# Patient Record
Sex: Male | Born: 1935 | Race: White | Hispanic: No | Smoking: Never smoker
Health system: Southern US, Community
[De-identification: ages and names within clinical notes are randomized; demographics above are authoritative.]

## PROBLEM LIST (undated history)

## (undated) DIAGNOSIS — H9190 Unspecified hearing loss, unspecified ear: Secondary | ICD-10-CM

## (undated) DIAGNOSIS — D649 Anemia, unspecified: Secondary | ICD-10-CM

## (undated) DIAGNOSIS — C801 Malignant (primary) neoplasm, unspecified: Secondary | ICD-10-CM

## (undated) DIAGNOSIS — Z9989 Dependence on other enabling machines and devices: Secondary | ICD-10-CM

## (undated) DIAGNOSIS — I350 Nonrheumatic aortic (valve) stenosis: Secondary | ICD-10-CM

## (undated) DIAGNOSIS — R931 Abnormal findings on diagnostic imaging of heart and coronary circulation: Secondary | ICD-10-CM

## (undated) DIAGNOSIS — I2510 Atherosclerotic heart disease of native coronary artery without angina pectoris: Secondary | ICD-10-CM

## (undated) DIAGNOSIS — Z952 Presence of prosthetic heart valve: Secondary | ICD-10-CM

## (undated) DIAGNOSIS — R911 Solitary pulmonary nodule: Secondary | ICD-10-CM

## (undated) DIAGNOSIS — I77810 Thoracic aortic ectasia: Secondary | ICD-10-CM

## (undated) DIAGNOSIS — F419 Anxiety disorder, unspecified: Secondary | ICD-10-CM

## (undated) DIAGNOSIS — M8580 Other specified disorders of bone density and structure, unspecified site: Secondary | ICD-10-CM

## (undated) DIAGNOSIS — I739 Peripheral vascular disease, unspecified: Secondary | ICD-10-CM

## (undated) DIAGNOSIS — I10 Essential (primary) hypertension: Secondary | ICD-10-CM

## (undated) DIAGNOSIS — E785 Hyperlipidemia, unspecified: Secondary | ICD-10-CM

## (undated) DIAGNOSIS — E559 Vitamin D deficiency, unspecified: Secondary | ICD-10-CM

## (undated) DIAGNOSIS — Z85038 Personal history of other malignant neoplasm of large intestine: Secondary | ICD-10-CM

## (undated) DIAGNOSIS — Z9289 Personal history of other medical treatment: Secondary | ICD-10-CM

## (undated) HISTORY — DX: Personal history of other medical treatment: Z92.89

## (undated) HISTORY — DX: Thoracic aortic ectasia: I77.810

## (undated) HISTORY — DX: Abnormal findings on diagnostic imaging of heart and coronary circulation: R93.1

## (undated) HISTORY — PX: CARDIAC CATHETERIZATION: SHX172

## (undated) HISTORY — DX: Other specified disorders of bone density and structure, unspecified site: M85.80

## (undated) HISTORY — DX: Essential (primary) hypertension: I10

## (undated) HISTORY — DX: Vitamin D deficiency, unspecified: E55.9

## (undated) HISTORY — DX: Peripheral vascular disease, unspecified: I73.9

## (undated) HISTORY — DX: Anemia, unspecified: D64.9

## (undated) HISTORY — DX: Nonrheumatic aortic (valve) stenosis: I35.0

## (undated) HISTORY — DX: Malignant (primary) neoplasm, unspecified: C80.1

## (undated) HISTORY — DX: Hyperlipidemia, unspecified: E78.5

---
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
## 2009-08-20 HISTORY — PX: COLONOSCOPY: SHX174

## 2016-10-16 ENCOUNTER — Institutional Professional Consult (permissible substitution) (INDEPENDENT_AMBULATORY_CARE_PROVIDER_SITE_OTHER): Payer: Medicare Other | Admitting: Thoracic Surgery (Cardiothoracic Vascular Surgery)

## 2016-10-16 ENCOUNTER — Encounter: Payer: Self-pay | Admitting: Thoracic Surgery (Cardiothoracic Vascular Surgery)

## 2016-10-16 ENCOUNTER — Encounter: Payer: Self-pay | Admitting: *Deleted

## 2016-10-16 VITALS — BP 129/70 | HR 82 | Resp 20 | Ht 68.5 in | Wt 164.2 lb

## 2016-10-16 DIAGNOSIS — I350 Nonrheumatic aortic (valve) stenosis: Secondary | ICD-10-CM

## 2016-10-16 DIAGNOSIS — I739 Peripheral vascular disease, unspecified: Secondary | ICD-10-CM

## 2016-10-16 DIAGNOSIS — I716 Thoracoabdominal aortic aneurysm, without rupture, unspecified: Secondary | ICD-10-CM

## 2016-10-16 DIAGNOSIS — E559 Vitamin D deficiency, unspecified: Secondary | ICD-10-CM | POA: Insufficient documentation

## 2016-10-16 DIAGNOSIS — I77810 Thoracic aortic ectasia: Secondary | ICD-10-CM

## 2016-10-16 DIAGNOSIS — D649 Anemia, unspecified: Secondary | ICD-10-CM | POA: Insufficient documentation

## 2016-10-16 DIAGNOSIS — M8580 Other specified disorders of bone density and structure, unspecified site: Secondary | ICD-10-CM

## 2016-10-16 DIAGNOSIS — D509 Iron deficiency anemia, unspecified: Secondary | ICD-10-CM

## 2016-10-16 NOTE — Progress Notes (Signed)
HEART AND [place] SURGERY CONSULTATION REPORT  Referring Provider is Lennice Sites Otelia Limes, PA-C PCP is Secundino Ginger, PA-C  Chief Complaint  Patient presents with  . Aortic Stenosis    Surgical eval, ECHO 09/19/16 [facility],    HPI:  Patient is an 83 year old male with history of aortic stenosis and remote history of rheumatic fever during childhood who has been referred for for surgical consultation to discuss management of aortic stenosis. The patient was noted to have a heart murmur several years ago and has been followed carefully by Isaias Cowman and Dr. Claudie Leach at [facility] in [place]. Recent follow-up echocardiogram was reported to demonstrate some progression in severity of aortic stenosis, and the patient was referred for surgical consultation.  The patient is widowed and lives in [place] with his son. He has remained remarkably healthy and physically active for a gentleman his age. He walks every morning for at least 45 minutes to an hour, reportedly at a fairly good pace. The patient states that he only gets short of breath that he really pushes himself strenuously. He can go up and down a flight of stairs without getting short of breath. He denies any history of chest pain or chest tightness either with activity or at rest. He denies any dizzy spells or near syncopal events. He has no trouble sleeping and he specifically denies PND, orthopnea, or lower extremity edema.  Past Medical History:  Diagnosis Date  . Anemia    iron defiency  . Aortic stenosis   . Cancer ([place])    1997  . Echocardiogram abnormal 09/19/2016  . H/O bone density study 12/01/2014   F/U DUE 11/30/16  . History of nuclear stress test 06/07/2014  . Hyperlipidemia    MIXED  . Hypertension   . Osteopenia   . PAD (peripheral artery disease) ([place])   . Thoracic aortic ectasia ([place])   . Vitamin D deficiency     Past  Surgical History:  Procedure Laterality Date  . COLONOSCOPY  08/2009   SCREENING..HYPLASTIC POLYPECTOMY..DR. DARRELUS    Family History  Problem Relation Age of Onset  . Hyperlipidemia Sister   . Hypertension Sister   . Heart disease Sister   . Hyperlipidemia Brother   . Hypertension Brother   . Heart disease Brother     Social History   Social History  . Marital status: Widowed    Spouse name: N/A  . Number of children: 1  . Years of education: N/A   Occupational History  . retired     Hydrographic surveyor   Social History Main Topics  . Smoking status: Never Smoker  . Smokeless tobacco: Never Used  . Alcohol use No  . Drug use: No  . Sexual activity: No   Other Topics Concern  . Not on file   Social History Narrative   Lives with is son    Current Outpatient Prescriptions  Medication Sig Dispense Refill  . amLODipine (NORVASC) 5 MG tablet Take 5 mg by mouth daily.    . cholecalciferol (VITAMIN D) 400 units TABS tablet Take 400 Units by mouth daily.    [name] docusate sodium (COLACE) 100 MG capsule Take 100 mg by mouth daily.    . finasteride (PROSCAR) 5 MG tablet Take 5 mg by mouth daily.    . Iron-FA-B Cmp-C-Biot-Probiotic (FUSION PLUS PO) Take 1 capsule by mouth daily.    [name] losartan (COZAAR) 50 MG tablet  Take 50 mg by mouth daily.    . Multiple Vitamin (MULTIVITAMIN) tablet Take 1 tablet by mouth daily.    . Omega-3 Fatty Acids (FISH OIL) 1000 MG CAPS Take 2 capsules by mouth 2 (two) times daily.    . psyllium (METAMUCIL) 58.6 % packet Take 1 packet by mouth daily.    . simvastatin (ZOCOR) 10 MG tablet Take 10 mg by mouth at bedtime.    [name] terazosin (HYTRIN) 5 MG capsule Take 5 mg by mouth daily.     No current facility-administered medications for this visit.     Allergies  Allergen Reactions  . Codeine Phosphate [Codeine] Shortness Of Breath  . Sulfa Antibiotics Shortness Of Breath  . Asa [Aspirin] Other (See Comments)    MILD ANEMIA      Review of  Systems:   General:  normal appetite, decreased energy, no weight gain, no weight loss, no fever  Cardiac:  no chest pain with exertion, no chest pain at rest, + SOB with strenuous exertion, no resting SOB, no PND, no orthopnea, no palpitations, no arrhythmia, no atrial fibrillation, no LE edema, no dizzy spells, no syncope  Respiratory:  no shortness of breath, no home oxygen, no productive cough, no dry cough, no bronchitis, no wheezing, no hemoptysis, no asthma, no pain with inspiration or cough, no sleep apnea, no CPAP at night  GI:   no difficulty swallowing, no reflux, no frequent heartburn, no hiatal hernia, no abdominal pain, no constipation, no diarrhea, no hematochezia, no hematemesis, no melena  GU:   no dysuria,  + frequency, no urinary tract infection, no hematuria, + enlarged prostate, no kidney stones, no kidney disease  Vascular:  no pain suggestive of claudication, no pain in feet, no leg cramps, no varicose veins, no DVT, no non-healing foot ulcer  Neuro:   no stroke, no TIA's, no seizures, no headaches, no temporary blindness one eye,  no slurred speech, no peripheral neuropathy, no chronic pain, no instability of gait, no memory/cognitive dysfunction  Musculoskeletal: no arthritis, no joint swelling, no myalgias, no difficulty walking, normal mobility   Skin:   no rash, no itching, no skin infections, no pressure sores or ulcerations  Psych:   no anxiety, no depression, no nervousness, no unusual recent stress  Eyes:   no blurry vision, no floaters, no recent vision changes, + wears glasses or contacts  ENT:   no hearing loss, no loose or painful teeth, + partial dentures, last saw dentist 09/09/2016  Hematologic:  no easy bruising, no abnormal bleeding, no clotting disorder, no frequent epistaxis  Endocrine:  no diabetes, does not check CBG's at home           Physical Exam:   BP 129/70   Pulse 82   Resp 20   Ht 5' 8.5" (1.74 m)   Wt 164 lb 3.9 oz (74.5 kg)   SpO2 97%    BMI 24.61 kg/m   General:  Elderly but well-appearing, looks younger than stated age  17:  Unremarkable   Neck:   no JVD, no bruits, no adenopathy   Chest:   clear to auscultation, symmetrical breath sounds, no wheezes, no rhonchi   CV:   RRR, grade III/VI crescendo/decrescendo murmur heard best at RUSB,  no diastolic murmur  Abdomen:  soft, non-tender, no masses   Extremities:  warm, well-perfused, pulses palpable, no LE edema  Rectal/GU  Deferred  Neuro:   Grossly non-focal and symmetrical throughout  Skin:   Clean and  dry, no rashes, no breakdown   Diagnostic Tests:  TRANSTHORACIC ECHOCARDIOGRAM  Both images and report from transthoracic echocardiogram performed 09/19/2016 at [facility] are reviewed. The patient has normal left ventricular function with mild concentric left ventricular hypertrophy. Ejection fraction was reported 60-65%. The aortic valve was trileaflet and moderately thickened with moderate restriction of leaflet mobility. Peak velocity across the aortic valve measured between 2.9 and 3.2 m/s corresponding to mean transvalvular gradient estimated as high as 23.7 mmHg. The aortic valve area was calculated by VTI at 0.9 cm. The DVI was not reported. No other significant abnormalities were noted.   Impression:  The patient has stage B moderate aortic stenosis that presumably has progressed in comparison with previous echocardiograms. He remains essentially asymptomatic with exertional shortness of breath occurring only with quite strenuous physical exertion. The patient is quite active physically and walks every morning without any significant limitations. I have personally reviewed his recent transthoracic echocardiogram. Image quality is fairly good. At this point I feel the patient's aortic stenosis remains moderate and not quite yet bordering on severe.   Plan:  The patient was counseled at length regarding treatment alternatives for management of  aortic stenosis. Indications for surgical intervention, the natural history of disease, and symptoms to be concerned about have been discussed at length. Alternative approaches such as conventional aortic valve replacement, transcatheter aortic valve replacement, and palliative medical therapy were compared and contrasted at length.  At this time I feel it makes sense to increase the frequency of surveillance. We plan follow-up echocardiogram in approximately 6 months. The patient has been counseled regarding symptoms be concerned about, in particular whether not he begins to notice worsening exertional shortness of breath, fatigue, chest discomfort, or dizzy spells. All of his questions have been addressed. He will return in approximately 6 months.   I spent in excess of 90 minutes during the conduct of this office consultation and >50% of this time involved direct face-to-face encounter with the patient for counseling and/or coordination of their care.      Valentina Gu. Roxy Manns, MD 10/16/2016 5:02 PM

## 2016-10-16 NOTE — Patient Instructions (Signed)
Continue all previous medications without any changes at this time  

## 2017-01-23 ENCOUNTER — Other Ambulatory Visit: Payer: Self-pay | Admitting: *Deleted

## 2017-01-23 DIAGNOSIS — I350 Nonrheumatic aortic (valve) stenosis: Secondary | ICD-10-CM

## 2017-04-07 ENCOUNTER — Other Ambulatory Visit: Payer: Self-pay

## 2017-04-07 ENCOUNTER — Encounter: Payer: Self-pay | Admitting: Thoracic Surgery (Cardiothoracic Vascular Surgery)

## 2017-04-07 ENCOUNTER — Ambulatory Visit (HOSPITAL_COMMUNITY): Payer: Medicare Other | Attending: Thoracic Surgery (Cardiothoracic Vascular Surgery)

## 2017-04-07 ENCOUNTER — Ambulatory Visit (INDEPENDENT_AMBULATORY_CARE_PROVIDER_SITE_OTHER): Payer: Medicare Other | Admitting: Thoracic Surgery (Cardiothoracic Vascular Surgery)

## 2017-04-07 VITALS — BP 110/76 | HR 93 | Ht 68.5 in | Wt 165.0 lb

## 2017-04-07 DIAGNOSIS — I350 Nonrheumatic aortic (valve) stenosis: Secondary | ICD-10-CM

## 2017-04-07 DIAGNOSIS — R42 Dizziness and giddiness: Secondary | ICD-10-CM | POA: Insufficient documentation

## 2017-04-07 NOTE — Patient Instructions (Signed)
Continue all previous medications without any changes at this time  

## 2017-04-07 NOTE — Progress Notes (Signed)
[facility]       CARDIOTHORACIC SURGERY NOTE  Referring Provider is Lennice Sites, Otelia Limes, PA-C PCP is Secundino Ginger, PA-C   HPI:  Patient is an 83 year old male with history of moderate aortic stenosis who returns the office today for routine follow-up.  He was originally seen in consultation on October 16, 2016.  At that time he remained asymptomatic and echocardiogram revealed findings consistent with moderate aortic stenosis with normal left ventricular systolic function.  He returns to the office today and reports that he is doing very well.  He remains quite active physically for a gentleman his age and he continues to maintain that he does not experience any significant symptoms of exertional shortness of breath, chest discomfort, or fatigue.  He has been evaluated for occasional mild dizzy spells and diagnosed with vertigo.  Symptoms of dizziness seem to be positional and not related to physical activity at all.  The remainder of his review of systems is unchanged from previously.   Current Outpatient Medications  Medication Sig Dispense Refill  . amLODipine (NORVASC) 5 MG tablet Take 5 mg by mouth daily.    . cholecalciferol (VITAMIN D) 400 units TABS tablet Take 400 Units by mouth daily.    [name] docusate sodium (COLACE) 100 MG capsule Take 100 mg by mouth daily.    . finasteride (PROSCAR) 5 MG tablet Take 5 mg by mouth daily.    . Iron-FA-B Cmp-C-Biot-Probiotic (FUSION PLUS PO) Take 1 capsule by mouth daily.    [name] losartan (COZAAR) 50 MG tablet Take 50 mg by mouth daily.    . Multiple Vitamin (MULTIVITAMIN) tablet Take 1 tablet by mouth daily.    . Omega-3 Fatty Acids (FISH OIL) 1000 MG CAPS Take 2 capsules by mouth 2 (two) times daily.    . psyllium (METAMUCIL) 58.6 % packet Take 1 packet by mouth daily.    . simvastatin (ZOCOR) 10 MG tablet Take 10 mg by mouth at bedtime.    [name] terazosin (HYTRIN) 5 MG capsule Take 5 mg by mouth  daily.     No current facility-administered medications for this visit.       Physical Exam:   BP 110/76 (BP Location: Left Arm, Patient Position: Sitting, Cuff Size: Normal)   Pulse 93   Ht 5' 8.5" (1.74 m)   Wt 165 lb (74.8 kg)   SpO2 98%   BMI 24.72 kg/m   General:  Well-appearing  Chest:   Clear to auscultation  CV:   Regular rate and rhythm with grade 3/6 systolic murmur heard along the sternal border  Incisions:  n/a  Abdomen:  Soft nontender  Extremities:  Warm and well perfused  Diagnostic Tests:  Transthoracic Echocardiography  Patient:    Stephen, Wolf MR #:       [id number] Study Date: 04/07/2017 Gender:     M Age:        28 Height:     174 cm Weight:     74.5 kg BSA:        1.91 m^2 Pt. Status: Room:   ATTENDING    Darylene Price, M.D.  ORDERING     Darylene Price, M.D.  REFERRING    Darylene Price, M.D.  SONOGRAPHER  Marygrace Drought, RCS  PERFORMING   [facility], Outpatient  cc:  ------------------------------------------------------------------- LV EF: 60% -   65%  ------------------------------------------------------------------- Indications:      AVD (I35.0).  ------------------------------------------------------------------- History:  PMH:  Dizziness.  ------------------------------------------------------------------- Study Conclusions  - Left ventricle: The cavity size was normal. Wall thickness was   increased in a pattern of mild LVH. Systolic function was normal.   The estimated ejection fraction was in the range of 60% to 65%.   Wall motion was normal; there were no regional wall motion   abnormalities. Doppler parameters are consistent with abnormal   left ventricular relaxation (grade 1 diastolic dysfunction). - Aortic valve: Moderately calcified annulus. Moderately thickened,   moderately calcified leaflets. There was moderate stenosis. Valve   area (VTI): 1.18 cm^2. Valve area (Vmax): 1.09 cm^2. Valve area   (Vmean): 1.07  cm^2. - Pulmonary arteries: Systolic pressure was moderately increased.   PA peak pressure: 44 mm Hg (S).  Impressions:  - Moderate - severe LV function   There is an apical thrombus .  ------------------------------------------------------------------- Study data:  Comparison was made to the study of 09/19/2016.  Study status:  Routine.  Procedure:  The patient reported no pain pre or post test. Transthoracic echocardiography. Image quality was adequate.          Transthoracic echocardiography.  M-mode, complete 2D, spectral Doppler, and color Doppler.  Birthdate: Patient birthdate: 04-15-1926.  Age:  Patient is 83 yr old.  Sex: Gender: male.    BMI: 24.6 kg/m^2.  Blood pressure:     139/70 Patient status:  Outpatient.  Study date:  Study date: 04/07/2017. Study time: 11:18 AM.  Location:  [facility]  -------------------------------------------------------------------  ------------------------------------------------------------------- Left ventricle:  The cavity size was normal. Wall thickness was increased in a pattern of mild LVH. Systolic function was normal. The estimated ejection fraction was in the range of 60% to 65%. Wall motion was normal; there were no regional wall motion abnormalities. Doppler parameters are consistent with abnormal left ventricular relaxation (grade 1 diastolic dysfunction).  ------------------------------------------------------------------- Aortic valve:   Moderately calcified annulus. Moderately thickened, moderately calcified leaflets.  Doppler:   There was moderate stenosis.      VTI ratio of LVOT to aortic valve: 0.38. Valve area (VTI): 1.18 cm^2. Indexed valve area (VTI): 0.62 cm^2/m^2. Peak velocity ratio of LVOT to aortic valve: 0.35. Valve area (Vmax): 1.09 cm^2. Indexed valve area (Vmax): 0.57 cm^2/m^2. Mean velocity ratio of LVOT to aortic valve: 0.34. Valve area (Vmean): 1.07 cm^2. Indexed valve area (Vmean): 0.56  cm^2/m^2.    Mean gradient (S): 20 mm Hg. Peak gradient (S): 41 mm Hg.  ------------------------------------------------------------------- Aorta:  Aortic root: The aortic root was normal in size. Ascending aorta: The ascending aorta was normal in size.  ------------------------------------------------------------------- Mitral valve:   The valve appears to be grossly normal.    Doppler:  There was no significant regurgitation.    Valve area by pressure half-time: 6.29 cm^2. Indexed valve area by pressure half-time: 3.3 cm^2/m^2.    Peak gradient (D): 3 mm Hg.  ------------------------------------------------------------------- Left atrium:  The atrium was normal in size.  ------------------------------------------------------------------- Right ventricle:  The cavity size was normal. Systolic function was normal.  ------------------------------------------------------------------- Pulmonic valve:    The valve appears to be grossly normal. Doppler:  There was no significant regurgitation.  ------------------------------------------------------------------- Tricuspid valve:   The valve appears to be grossly normal. Doppler:  There was trivial regurgitation.  ------------------------------------------------------------------- Pulmonary artery:   Systolic pressure was moderately increased.  ------------------------------------------------------------------- Right atrium:  The atrium was normal in size.  ------------------------------------------------------------------- Pericardium:  There was no pericardial effusion.  ------------------------------------------------------------------- Systemic veins: Inferior vena cava: The vessel was normal in  size. The respirophasic diameter changes were in the normal range (= 50%), consistent with normal central venous pressure. Diameter: 17  mm.  ------------------------------------------------------------------- Measurements   IVC                                      Value          Reference  ID                                       17    mm       ----------    Left ventricle                           Value          Reference  LV ID, ED, PLAX chordal          (L)     42    mm       43 - 52  LV ID, ES, PLAX chordal                  24    mm       23 - 38  LV fx shortening, PLAX chordal           43    %        >=29  LV PW thickness, ED                      13    mm       ----------  IVS/LV PW ratio, ED                      0.69           <=1.3  Stroke volume, 2D                        71    ml       ----------  Stroke volume/bsa, 2D                    37    ml/m^2   ----------  LV e&', lateral                           6.2   cm/s     ----------  LV E/e&', lateral                         13.06          ----------  LV e&', medial                            4.03  cm/s     ----------  LV E/e&', medial                          20.1           ----------  LV e&', average  5.12  cm/s     ----------  LV E/e&', average                         15.84          ----------    Ventricular septum                       Value          Reference  IVS thickness, ED                        9     mm       ----------    LVOT                                     Value          Reference  LVOT ID, S                               20    mm       ----------  LVOT area                                3.14  cm^2     ----------  LVOT peak velocity, S                    111   cm/s     ----------  LVOT mean velocity, S                    70.5  cm/s     ----------  LVOT VTI, S                              22.5  cm       ----------    Aortic valve                             Value          Reference  Aortic valve peak velocity, S            321   cm/s     ----------  Aortic valve mean velocity, S            206   cm/s      ----------  Aortic valve VTI, S                      60    cm       ----------  Aortic mean gradient, S                  20    mm Hg    ----------  Aortic peak gradient, S                  41    mm Hg    ----------  VTI ratio, LVOT/AV                       0.38           ----------  Aortic valve area, VTI                   1.18  cm^2     ----------  Aortic valve area/bsa, VTI               0.62  cm^2/m^2 ----------  Velocity ratio, peak, LVOT/AV            0.35           ----------  Aortic valve area, peak velocity         1.09  cm^2     ----------  Aortic valve area/bsa, peak              0.57  cm^2/m^2 ----------  velocity  Velocity ratio, mean, LVOT/AV            0.34           ----------  Aortic valve area, mean velocity         1.07  cm^2     ----------  Aortic valve area/bsa, mean              0.56  cm^2/m^2 ----------  velocity    Aorta                                    Value          Reference  Aortic root ID, ED                       33    mm       ----------    Left atrium                              Value          Reference  LA ID, A-P, ES                           32    mm       ----------  LA ID/bsa, A-P                           1.68  cm/m^2   <=2.2  LA volume, S                             38.8  ml       ----------  LA volume/bsa, S                         20.4  ml/m^2   ----------  LA volume, ES, 1-p A4C                   28    ml       ----------  LA volume/bsa, ES, 1-p A4C               14.7  ml/m^2   ----------  LA volume, ES, 1-p A2C                   49.8  ml       ----------  LA volume/bsa, ES, 1-p A2C  26.1  ml/m^2   ----------    Mitral valve                             Value          Reference  Mitral E-wave peak velocity              81    cm/s     ----------  Mitral A-wave peak velocity              134   cm/s     ----------  Mitral deceleration time         (L)     120   ms       150 - 230  Mitral pressure half-time                35    ms        ----------  Mitral peak gradient, D                  3     mm Hg    ----------  Mitral E/A ratio, peak                   0.6            ----------  Mitral valve area, PHT, DP               6.29  cm^2     ----------  Mitral valve area/bsa, PHT, DP           3.3   cm^2/m^2 ----------    Pulmonary arteries                       Value          Reference  PA pressure, S, DP               (H)     44    mm Hg    <=30    Tricuspid valve                          Value          Reference  Tricuspid regurg peak velocity           321   cm/s     ----------  Tricuspid peak RV-RA gradient            41    mm Hg    ----------  Tricuspid maximal regurg                 321   cm/s     ----------  velocity, PISA    Right atrium                             Value          Reference  RA ID, S-I, ES, A4C              (H)     49.9  mm       34 - 49  RA area, ES, A4C                         14.4  cm^2     8.3 - 19.5  RA volume, ES, A/L                       33.2  ml       ----------  RA volume/bsa, ES, A/L                   17.4  ml/m^2   ----------    Systemic veins                           Value          Reference  Estimated CVP                            3     mm Hg    ----------    Right ventricle                          Value          Reference  TAPSE                                    27.4  mm       ----------  RV pressure, S, DP               (H)     44    mm Hg    <=30  RV s&', lateral, S                        16.3  cm/s     ----------  Legend: (L)  and  (H)  mark values outside specified reference range.  ------------------------------------------------------------------- Prepared and Electronically Authenticated by  Mertie Moores, M.D. 2018-12-17T14:23:35   Impression:  Patient has stage B moderate aortic stenosis.  I have personally reviewed the patient's recent follow-up echocardiogram performed earlier today.  Left ventricular systolic function remains normal.  The aortic valve  is trileaflet with moderate thickening and calcification involving 2 of the 3 leaflets.  The third leaflet seems to move a little better than the other 2.  Peak velocity across the aortic valve range between 2.9 and 3.2 m/s corresponding to mean transvalvular gradient estimated between 18 and 22 mmHg.  The report from this echocardiogram mentions the presence of LV apical thrombus and severe left ventricular systolic dysfunction.  I suspect this must be an error.  The patient remains active physically and denies any significant symptoms of exertional shortness of breath or chest discomfort.  He has had some occasional mild dizzy spells that sound more like vertigo less likely to be cardiogenic.   Plan:  We have not recommended any changes to the patient's current medications.   We will contact the echo lab and Dr. Acie Fredrickson to inquire regarding the report from the echocardiogram performed earlier today.  Presuming that this report was erroneous we will plan to have the patient return for routine follow-up in approximately 6 months.  He will call and return sooner should symptoms develop.  I have cautioned the patient to be mindful of any significant changes in his symptoms and to call and return if he develops increased problems with dizzy spells or any near syncopal events.     I spent in excess of 15 minutes during the  conduct of this office consultation and >50% of this time involved direct face-to-face encounter with the patient for counseling and/or coordination of their care.    Valentina Gu. Roxy Manns, MD 04/07/2017 1:25 PM   Addendum:  We have confirmed that the echo report was erroneous.  This has been corrected.  Rexene Alberts, MD 04/07/2017 3:59 PM

## 2017-10-13 ENCOUNTER — Encounter: Payer: Medicare Other | Admitting: Thoracic Surgery (Cardiothoracic Vascular Surgery)

## 2017-10-30 ENCOUNTER — Encounter: Payer: Medicare Other | Admitting: Thoracic Surgery (Cardiothoracic Vascular Surgery)

## 2017-11-03 ENCOUNTER — Encounter: Payer: Self-pay | Admitting: Thoracic Surgery (Cardiothoracic Vascular Surgery)

## 2017-11-03 ENCOUNTER — Ambulatory Visit (INDEPENDENT_AMBULATORY_CARE_PROVIDER_SITE_OTHER): Payer: Medicare Other | Admitting: Thoracic Surgery (Cardiothoracic Vascular Surgery)

## 2017-11-03 VITALS — BP 160/88 | HR 78 | Resp 20 | Ht 68.5 in | Wt 156.0 lb

## 2017-11-03 DIAGNOSIS — I350 Nonrheumatic aortic (valve) stenosis: Secondary | ICD-10-CM

## 2017-11-03 NOTE — Patient Instructions (Addendum)
Continue all previous medications without any changes at this time  Call for follow up echocardiogram and appointment if you develop any symptoms of shortness of breath or chest discomfort associated with physical exertion

## 2017-11-03 NOTE — Progress Notes (Addendum)
[address]       [place] [address]             [phone]     CARDIOTHORACIC SURGERY OFFICE NOTE  Referring Provider is Lennice Sites Otelia Limes, PA-C PCP is Secundino Ginger, PA-C   HPI:  Patient is an 84 year old male with history of moderate aortic stenosis who returns the office today for routine follow-up.  He was originally seen in consultation on October 16, 2016.  At that time he remained asymptomatic and echocardiogram revealed findings consistent with moderate aortic stenosis with normal left ventricular systolic function.     He was last seen in our office on April 07, 2018 at which time he was doing well and remained asymptomatic.  He returns to our office today for follow-up.  The patient states that he continues to do very well he walks every morning before breakfast and denies any symptoms of exertional shortness of breath or chest discomfort.  He was seen in the emergency room at [facility] in June following an episode of epigastric chest and abdominal discomfort which was attributed to indigestion.  He reportedly specifically denied any symptoms of exertional shortness of breath at that time.   He underwent a follow-up echocardiogram which reportedly demonstrated stable findings consistent with moderate aortic stenosis and normal left ventricular systolic function.  Troponins were negative.  He returns to our office today for routine follow-up.  He has not had any further episodes of discomfort in his chest or abdomen and overall he feels quite well.  He has no complaints.  The remainder of his review of systems is unremarkable and he reports no new medical problems over the past 6 months.   Current Outpatient Medications  Medication Sig Dispense Refill  . amLODipine (NORVASC) 5 MG tablet Take 5 mg by mouth daily.    . cholecalciferol (VITAMIN D) 400 units TABS tablet Take 400 Units by mouth daily.    [name] docusate sodium (COLACE) 100 MG  capsule Take 100 mg by mouth daily.    . finasteride (PROSCAR) 5 MG tablet Take 5 mg by mouth daily.    . Iron-FA-B Cmp-C-Biot-Probiotic (FUSION PLUS PO) Take 1 capsule by mouth daily.    [name] losartan (COZAAR) 50 MG tablet Take 50 mg by mouth daily.    . Multiple Vitamin (MULTIVITAMIN) tablet Take 1 tablet by mouth daily.    . Omega-3 Fatty Acids (FISH OIL) 1000 MG CAPS Take 2 capsules by mouth 2 (two) times daily.    . psyllium (METAMUCIL) 58.6 % packet Take 1 packet by mouth daily.    . simvastatin (ZOCOR) 10 MG tablet Take 10 mg by mouth at bedtime.     No current facility-administered medications for this visit.       Physical Exam:   BP (!) 160/88   Pulse 78   Resp 20   Ht 5' 8.5" (1.74 m)   Wt 156 lb (70.8 kg)   SpO2 99% Comment: RA  BMI 23.37 kg/m    General:  Well-appearing  Chest:   Clear to auscultation  CV:   Regular rate and rhythm with crescendo decrescendo systolic murmur heard best at right sternal border  Incisions:  n/a  Abdomen:  Soft nontender  Extremities:  Warm and well-perfused  Diagnostic Tests:  Report of transthoracic echocardiogram performed at [facility] on October 10, 2017 is reviewed.  Images from this examination are not  currently available for review.  By report the patient had moderate aortic stenosis with normal left ventricular size and systolic function.  Left ventricular ejection fraction was estimated 55 to 60%.  Peak velocity across the aortic valve was reported 2.9 m/s corresponding to mean transvalvular gradient estimated 21 mmHg.  No other significant abnormalities were noted.   Impression:  Patient has moderate aortic stenosis with preserved left ventricular systolic function.  He remains asymptomatic.  He was recently evaluated at [facility] for a brief episode of epigastric and upper abdominal discomfort that does not sound like angina.  Diagnostic cardiac catheterization was not performed.  I  do not see any report of a stress test.  Report from the echocardiogram performed at that time was notable for no significant progression in the severity of aortic stenosis and normal LV systolic function.    Plan:  We recommend continued biannual follow-up visits with repeat echocardiograms at least once a year.  We will continue to follow the patient regularly while he is seen by his primary cardiologist in [facility].  The patient will return to our office in approximately 1 year.  He will call and return sooner should problems or questions arise.  I spent in excess of 15 minutes during the conduct of this office consultation and >50% of this time involved direct face-to-face encounter with the patient for counseling and/or coordination of their care.   Valentina Gu. Roxy Manns, MD 11/03/2017 12:03 PM

## 2018-08-21 ENCOUNTER — Other Ambulatory Visit: Payer: Self-pay | Admitting: Thoracic Surgery (Cardiothoracic Vascular Surgery)

## 2018-08-21 DIAGNOSIS — I350 Nonrheumatic aortic (valve) stenosis: Secondary | ICD-10-CM

## 2018-08-21 NOTE — Progress Notes (Signed)
2

## 2018-09-01 ENCOUNTER — Telehealth (HOSPITAL_COMMUNITY): Payer: Self-pay | Admitting: *Deleted

## 2018-09-01 NOTE — Telephone Encounter (Signed)
  Talked to Stephen Wolf 09/01/18 to do his prescreening for Covid 19 and to confirm echo for 09/04/18. He told me that he already  had an echo at [facility]. PA-C office in [facility] on Monday. I told him I would call him back.

## 2018-09-02 ENCOUNTER — Telehealth (HOSPITAL_COMMUNITY): Payer: Self-pay | Admitting: *Deleted

## 2018-09-02 ENCOUNTER — Encounter: Payer: Self-pay | Admitting: Cardiothoracic Surgery

## 2018-09-02 NOTE — Telephone Encounter (Signed)
Talked to Stephen Wolf he told me he got the disk and report of the echo from his Heart doctor in [place] and he will be taking them to Dr. Ricard Dillon office

## 2018-09-04 ENCOUNTER — Other Ambulatory Visit (HOSPITAL_COMMUNITY): Payer: Medicare Other

## 2018-09-21 ENCOUNTER — Other Ambulatory Visit: Payer: Self-pay

## 2018-09-21 ENCOUNTER — Encounter: Payer: Self-pay | Admitting: Thoracic Surgery (Cardiothoracic Vascular Surgery)

## 2018-09-21 ENCOUNTER — Ambulatory Visit (INDEPENDENT_AMBULATORY_CARE_PROVIDER_SITE_OTHER): Payer: Medicare Other | Admitting: Thoracic Surgery (Cardiothoracic Vascular Surgery)

## 2018-09-21 VITALS — BP 175/82 | HR 74 | Temp 97.5°F | Resp 16 | Ht 68.5 in | Wt 149.0 lb

## 2018-09-21 DIAGNOSIS — I350 Nonrheumatic aortic (valve) stenosis: Secondary | ICD-10-CM

## 2018-09-21 NOTE — Patient Instructions (Signed)
Continue all previous medications without any changes at this time  

## 2018-09-21 NOTE — Progress Notes (Signed)
[address]       [place] [address]             [phone]     CARDIOTHORACIC SURGERY OFFICE NOTE  Referring Provider is Lennice Sites Otelia Limes, PA-C PCP is Secundino Ginger, PA-C   HPI:  Patient is a 85 year old male with history of aortic stenosis who returns to our office today for follow-up.  He was last seen in consultation on November 03, 2017.  Echocardiogram at that time revealed normal left ventricular systolic function with hemodynamic findings suggestive of moderate aortic stenosis.  At the time the patient denied any symptoms of exertional shortness of breath or chest discomfort.  Over the past 5 to 6 months the patient has developed progressive symptoms of increasing fatigue, generalized weakness, exertional shortness of breath, and occasional dizzy spells.  He states that his legs feel heavy.  He has not had any chest pain or chest tightness either with activity or at rest.  He has not had resting shortness of breath, PND, orthopnea, or lower extremity edema.  He states that he has been feeling more anxious and he is found it difficult to complete simple tasks.  He stopped driving an automobile.  States that his gait has become somewhat unsteady but he has not had any mechanical falls.  He was seen in follow-up recently by Lynann Bologna and transthoracic echocardiogram performed Aug 31, 2018 revealed significant progression in severity of aortic stenosis.  Left ventricular systolic function remained normal with ejection fraction estimated 55 to 60%.  Peak velocity across the aortic valve measured 3.9 m/s corresponding to a mean transvalvular gradient estimated 38 mmHg.  Follow-up cardiothoracic surgical consultation was requested in the patient's previously scheduled appointment was moved up early.  Patient is a widower and lives with his son in [place].  He has been retired for many years.  Up until the past 6 months he had remained physically reasonably active and  functionally independent.  The remainder of his review of systems is unchanged from previously.  The patient specifically denies any recent fevers, cough, or accelerated shortness of breath.  The patient has not been traveling and he has not been exposed to any persons with known or suspected COVID-19 infection.     Current Outpatient Medications  Medication Sig Dispense Refill   amLODipine (NORVASC) 5 MG tablet Take 5 mg by mouth 2 (two) times a day.      cholecalciferol (VITAMIN D) 400 units TABS tablet Take 400 Units by mouth daily.     docusate sodium (COLACE) 100 MG capsule Take 100 mg by mouth daily.     finasteride (PROSCAR) 5 MG tablet Take 5 mg by mouth daily.     Iron-FA-B Cmp-C-Biot-Probiotic (FUSION PLUS PO) Take 1 capsule by mouth daily.     losartan (COZAAR) 50 MG tablet Take 50 mg by mouth daily.     Multiple Vitamin (MULTIVITAMIN) tablet Take 1 tablet by mouth daily.     Omega-3 Fatty Acids (FISH OIL) 1000 MG CAPS Take 2 capsules by mouth 2 (two) times daily.     psyllium (METAMUCIL) 58.6 % packet Take 1 packet by mouth daily.     simvastatin (ZOCOR) 10 MG tablet Take 20 mg by mouth at bedtime.      No current facility-administered medications for this visit.       Physical Exam:   BP (!) 175/82 (BP Location: Right Arm, Patient Position: Sitting, Cuff Size:  Normal)    Pulse 74    Temp (!) 97.5 F (36.4 C) (Skin)    Resp 16    Ht 5' 8.5" (1.74 m)    Wt 149 lb (67.6 kg)    SpO2 98% Comment: ON RA   BMI 22.33 kg/m   General:  Elderly but well-appearing, somewhat weak  Chest:   Clear to auscultation with symmetrical breath sounds  CV:   Regular rate and rhythm with harsh crescendo decrescendo systolic murmur heard along the sternal border  Incisions:  n/a  Abdomen:  Soft nontender  Extremities:  Warm and well-perfused  Diagnostic Tests:  TRANSTHORACIC ECHOCARDIOGRAM  Both images and report from transthoracic echocardiogram performed Aug 31, 2018 [facility] in [place] are reviewed.  There is normal left ventricular size and systolic function with mild concentric left ventricular hypertrophy.  Ejection fraction was estimated 55 to 60%.  The aortic valve is trileaflet.  There is moderate to severe thickening with restricted leaflet mobility involving all 3 leaflets of the aortic valve.  Peak velocity across aortic valve measured 3.9 m/s corresponding to a mean transvalvular gradient estimated 38 mmHg.  DVI was not reported.  No other significant abnormalities were noted.   Impression:  Patient has stage D severe symptomatic aortic stenosis.  Over the past 5 to 6 months he has developed worsening symptoms of exertional fatigue and shortness of breath consistent with chronic diastolic congestive heart failure, New York Heart Association functional class IIb.  He has recently also been having some mild dizzy spells without syncope.  He denies symptoms of chest pain or chest tightness either with activity or at rest.  I personally reviewed the patient's recent follow-up transthoracic echocardiogram.  The patient's aortic stenosis is clearly progressed in comparison with previous studies.  I think the patient would likely benefit from aortic valve replacement.  However, risks associated with conventional surgery would likely be prohibitive because of the patient's advanced age and somewhat frail physical stature.  Hopefully he will be a good candidate for transcatheter aortic valve replacement.    Plan:  The patient and his son were counseled at length regarding treatment alternatives for management of severe symptomatic aortic stenosis. Alternative approaches such as conventional aortic valve replacement, transcatheter aortic valve replacement, and continued medical therapy without intervention were compared and contrasted at length.  The risks associated with conventional surgical aortic valve replacement were discussed in detail, as were  expectations for post-operative convalescence, and why I would be reluctant to consider this patient a candidate for conventional surgery.  Issues specific to transcatheter aortic valve replacement were discussed including questions about long term valve durability, the potential for paravalvular leak, possible increased risk of need for permanent pacemaker placement, and other technical complications related to the procedure itself.  Long-term prognosis with medical therapy was discussed. This discussion was placed in the context of the patient's own specific clinical presentation and past medical history.  All of their questions have been addressed.  The patient desires to proceed with further diagnostic testing to evaluate the feasibility of transcatheter aortic valve replacement.  As the next step the patient will undergo left and right heart catheterization and follow-up transthoracic echocardiogram at the [facility].  The patient will also need to undergo CT angiography and a formal physical therapy evaluation.  The patient's case and multiple diagnostic tests will be reviewed by a multidisciplinary team and specialist.  Depending upon findings we could potentially make  plans to proceed with transcatheter aortic valve replacement within the next few weeks.  All of the questions have been addressed.   I spent in excess of 30 minutes during the conduct of this office consultation and >50% of this time involved direct face-to-face encounter with the patient for counseling and/or coordination of their care.    Valentina Gu. Roxy Manns, MD 09/21/2018 12:45 PM

## 2018-09-21 NOTE — H&P (View-Only) (Signed)
[address]       [place] [address]             [phone]     CARDIOTHORACIC SURGERY OFFICE NOTE  Referring Provider is Lennice Sites Otelia Limes, PA-C PCP is Secundino Ginger, PA-C   HPI:  Patient is a 85 year old male with history of aortic stenosis who returns to our office today for follow-up.  He was last seen in consultation on November 03, 2017.  Echocardiogram at that time revealed normal left ventricular systolic function with hemodynamic findings suggestive of moderate aortic stenosis.  At the time the patient denied any symptoms of exertional shortness of breath or chest discomfort.  Over the past 5 to 6 months the patient has developed progressive symptoms of increasing fatigue, generalized weakness, exertional shortness of breath, and occasional dizzy spells.  He states that his legs feel heavy.  He has not had any chest pain or chest tightness either with activity or at rest.  He has not had resting shortness of breath, PND, orthopnea, or lower extremity edema.  He states that he has been feeling more anxious and he is found it difficult to complete simple tasks.  He stopped driving an automobile.  States that his gait has become somewhat unsteady but he has not had any mechanical falls.  He was seen in follow-up recently by Lynann Bologna and transthoracic echocardiogram performed Aug 31, 2018 revealed significant progression in severity of aortic stenosis.  Left ventricular systolic function remained normal with ejection fraction estimated 55 to 60%.  Peak velocity across the aortic valve measured 3.9 m/s corresponding to a mean transvalvular gradient estimated 38 mmHg.  Follow-up cardiothoracic surgical consultation was requested in the patient's previously scheduled appointment was moved up early.  Patient is a widower and lives with his son in [place].  He has been retired for many years.  Up until the past 6 months he had remained physically reasonably active and  functionally independent.  The remainder of his review of systems is unchanged from previously.  The patient specifically denies any recent fevers, cough, or accelerated shortness of breath.  The patient has not been traveling and he has not been exposed to any persons with known or suspected COVID-19 infection.     Current Outpatient Medications  Medication Sig Dispense Refill   amLODipine (NORVASC) 5 MG tablet Take 5 mg by mouth 2 (two) times a day.      cholecalciferol (VITAMIN D) 400 units TABS tablet Take 400 Units by mouth daily.     docusate sodium (COLACE) 100 MG capsule Take 100 mg by mouth daily.     finasteride (PROSCAR) 5 MG tablet Take 5 mg by mouth daily.     Iron-FA-B Cmp-C-Biot-Probiotic (FUSION PLUS PO) Take 1 capsule by mouth daily.     losartan (COZAAR) 50 MG tablet Take 50 mg by mouth daily.     Multiple Vitamin (MULTIVITAMIN) tablet Take 1 tablet by mouth daily.     Omega-3 Fatty Acids (FISH OIL) 1000 MG CAPS Take 2 capsules by mouth 2 (two) times daily.     psyllium (METAMUCIL) 58.6 % packet Take 1 packet by mouth daily.     simvastatin (ZOCOR) 10 MG tablet Take 20 mg by mouth at bedtime.      No current facility-administered medications for this visit.       Physical Exam:   BP (!) 175/82 (BP Location: Right Arm, Patient Position: Sitting, Cuff Size:  Normal)    Pulse 74    Temp (!) 97.5 F (36.4 C) (Skin)    Resp 16    Ht 5' 8.5" (1.74 m)    Wt 149 lb (67.6 kg)    SpO2 98% Comment: ON RA   BMI 22.33 kg/m   General:  Elderly but well-appearing, somewhat weak  Chest:   Clear to auscultation with symmetrical breath sounds  CV:   Regular rate and rhythm with harsh crescendo decrescendo systolic murmur heard along the sternal border  Incisions:  n/a  Abdomen:  Soft nontender  Extremities:  Warm and well-perfused  Diagnostic Tests:  TRANSTHORACIC ECHOCARDIOGRAM  Both images and report from transthoracic echocardiogram performed Aug 31, 2018 at [facility] in [place] are reviewed.  There is normal left ventricular size and systolic function with mild concentric left ventricular hypertrophy.  Ejection fraction was estimated 55 to 60%.  The aortic valve is trileaflet.  There is moderate to severe thickening with restricted leaflet mobility involving all 3 leaflets of the aortic valve.  Peak velocity across aortic valve measured 3.9 m/s corresponding to a mean transvalvular gradient estimated 38 mmHg.  DVI was not reported.  No other significant abnormalities were noted.   Impression:  Patient has stage D severe symptomatic aortic stenosis.  Over the past 5 to 6 months he has developed worsening symptoms of exertional fatigue and shortness of breath consistent with chronic diastolic congestive heart failure, New York Heart Association functional class IIb.  He has recently also been having some mild dizzy spells without syncope.  He denies symptoms of chest pain or chest tightness either with activity or at rest.  I personally reviewed the patient's recent follow-up transthoracic echocardiogram.  The patient's aortic stenosis is clearly progressed in comparison with previous studies.  I think the patient would likely benefit from aortic valve replacement.  However, risks associated with conventional surgery would likely be prohibitive because of the patient's advanced age and somewhat frail physical stature.  Hopefully he will be a good candidate for transcatheter aortic valve replacement.    Plan:  The patient and his son were counseled at length regarding treatment alternatives for management of severe symptomatic aortic stenosis. Alternative approaches such as conventional aortic valve replacement, transcatheter aortic valve replacement, and continued medical therapy without intervention were compared and contrasted at length.  The risks associated with conventional surgical aortic valve replacement were discussed in detail, as were  expectations for post-operative convalescence, and why I would be reluctant to consider this patient a candidate for conventional surgery.  Issues specific to transcatheter aortic valve replacement were discussed including questions about long term valve durability, the potential for paravalvular leak, possible increased risk of need for permanent pacemaker placement, and other technical complications related to the procedure itself.  Long-term prognosis with medical therapy was discussed. This discussion was placed in the context of the patient's own specific clinical presentation and past medical history.  All of their questions have been addressed.  The patient desires to proceed with further diagnostic testing to evaluate the feasibility of transcatheter aortic valve replacement.  As the next step the patient will undergo left and right heart catheterization and follow-up transthoracic echocardiogram at the [facility].  The patient will also need to undergo CT angiography and a formal physical therapy evaluation.  The patient's case and multiple diagnostic tests will be reviewed by a multidisciplinary team and specialist.  Depending upon findings we could potentially make  plans to proceed with transcatheter aortic valve replacement within the next few weeks.  All of the questions have been addressed.   I spent in excess of 30 minutes during the conduct of this office consultation and >50% of this time involved direct face-to-face encounter with the patient for counseling and/or coordination of their care.    Valentina Gu. Roxy Manns, MD 09/21/2018 12:45 PM

## 2018-09-23 ENCOUNTER — Other Ambulatory Visit: Payer: Self-pay

## 2018-09-23 DIAGNOSIS — I350 Nonrheumatic aortic (valve) stenosis: Secondary | ICD-10-CM

## 2018-09-25 ENCOUNTER — Telehealth: Payer: Self-pay | Admitting: *Deleted

## 2018-09-25 NOTE — Telephone Encounter (Signed)
    COVID-19 Pre-Screening Questions:  . In the past 7 to 10 days have you had a cough,  shortness of breath, headache, congestion, fever (100 or greater) body aches, chills, sore throat, or sudden loss of taste or sense of smell? . Have you been around anyone with known Covid 19. . Have you been around anyone who is awaiting Covid 19 test results in the past 7 to 10 days? . Have you been around anyone who has been exposed to Covid 19, or has mentioned symptoms of Covid 19 within the past 7 to 10 days?  If you have any concerns/questions about symptoms patients report during screening (either on the phone or at threshold). Contact the provider seeing the patient or DOD for further guidance.  If neither are available contact a member of the leadership team.           Contacted patient via telephone call. No to call Covid 19 questions . Understands the importantce of a mask. KB  

## 2018-09-28 ENCOUNTER — Other Ambulatory Visit (HOSPITAL_COMMUNITY): Payer: Medicare Other

## 2018-09-28 ENCOUNTER — Other Ambulatory Visit: Payer: Medicare Other

## 2018-09-28 ENCOUNTER — Telehealth: Payer: Self-pay

## 2018-09-28 NOTE — Telephone Encounter (Signed)
thx

## 2018-09-28 NOTE — Telephone Encounter (Signed)
I contacted [facility] and left a message for Roque Cash PA-C to make him aware that the pt cancelled cardiac catheterization at this time and the pt requested to do additional research on the TAVR procedure.

## 2018-09-28 NOTE — Telephone Encounter (Signed)
  [facility]   MULTIDISCIPLINARY HEART VALVE TEAM  The pt left a voicemail on 6/5 at 7:02 PM in regards to wanting to cancel his lab/Covid apts on 6/8 and cardiac catheterization on 6/11.  The pt would like to do additional research on TAVR before proceeding with testing. I have cancelled per the pt's request. At this time the pt does not have any additional questions.  I advised the pt to contact me with any questions or concerns. He will also contact me if he decides to proceed with TAVR evaluation. Pt agreed with plan.

## 2018-10-01 ENCOUNTER — Ambulatory Visit (HOSPITAL_COMMUNITY): Admission: RE | Admit: 2018-10-01 | Payer: Medicare Other | Source: Home / Self Care | Admitting: Cardiovascular Disease

## 2018-10-01 ENCOUNTER — Encounter (HOSPITAL_COMMUNITY): Admission: RE | Payer: Self-pay | Source: Home / Self Care

## 2018-10-01 SURGERY — RIGHT/LEFT HEART CATH AND CORONARY ANGIOGRAPHY
Anesthesia: LOCAL

## 2018-10-12 ENCOUNTER — Telehealth: Payer: Self-pay | Admitting: *Deleted

## 2018-10-12 NOTE — Telephone Encounter (Signed)
    COVID-19 Pre-Screening Questions:  . In the past 7 to 10 days have you had a cough,  shortness of breath, headache, congestion, fever (100 or greater) body aches, chills, sore throat, or sudden loss of taste or sense of smell? . Have you been around anyone with known Covid 19. . Have you been around anyone who is awaiting Covid 19 test results in the past 7 to 10 days? . Have you been around anyone who has been exposed to Covid 19, or has mentioned symptoms of Covid 19 within the past 7 to 10 days?  If you have any concerns/questions about symptoms patients report during screening (either on the phone or at threshold). Contact the provider seeing the patient or DOD for further guidance.  If neither are available contact a member of the leadership team.            Contacted Pt via phone call . Answered all Covid 19 questions no. Has a mask.KB

## 2018-10-13 ENCOUNTER — Other Ambulatory Visit: Payer: Self-pay

## 2018-10-13 ENCOUNTER — Other Ambulatory Visit: Payer: Medicare Other

## 2018-10-13 ENCOUNTER — Other Ambulatory Visit (HOSPITAL_COMMUNITY)
Admission: RE | Admit: 2018-10-13 | Discharge: 2018-10-13 | Disposition: A | Discharge status: Home / Self Care | Payer: Medicare Other | Source: Ambulatory Visit | Attending: Cardiovascular Disease | Admitting: Cardiovascular Disease

## 2018-10-13 DIAGNOSIS — I350 Nonrheumatic aortic (valve) stenosis: Secondary | ICD-10-CM

## 2018-10-13 DIAGNOSIS — Z1159 Encounter for screening for other viral diseases: Secondary | ICD-10-CM | POA: Diagnosis not present

## 2018-10-13 LAB — BASIC METABOLIC PANEL
BUN/Creatinine Ratio: 12 (ref 10–24)
BUN: 12 mg/dL (ref 10–36)
CO2: 25 mmol/L (ref 20–29)
Calcium: 9.8 mg/dL (ref 8.6–10.2)
Chloride: 91 mmol/L — ABNORMAL LOW (ref 96–106)
Creatinine, Ser: 0.98 mg/dL (ref 0.76–1.27)
GFR calc Af Amer: 78 mL/min/{1.73_m2} (ref >59)
GFR calc non Af Amer: 68 mL/min/{1.73_m2} (ref >59)
Glucose: 120 mg/dL — ABNORMAL HIGH (ref 65–99)
Potassium: 4.9 mmol/L (ref 3.5–5.2)
Sodium: 129 mmol/L — ABNORMAL LOW (ref 134–144)

## 2018-10-13 LAB — CBC
Hematocrit: 34.7 % — ABNORMAL LOW (ref 37.5–51.0)
Hemoglobin: 12.1 g/dL — ABNORMAL LOW (ref 13.0–17.7)
MCH: 33.9 pg — ABNORMAL HIGH (ref 26.6–33.0)
MCHC: 34.9 g/dL (ref 31.5–35.7)
MCV: 97 fL (ref 79–97)
Platelets: 336 10*3/uL (ref 150–450)
RBC: 3.57 x10E6/uL — ABNORMAL LOW (ref 4.14–5.80)
RDW: 12.2 % (ref 11.6–15.4)
WBC: 6.6 10*3/uL (ref 3.4–10.8)

## 2018-10-13 LAB — SARS CORONAVIRUS 2 (TAT 6-24 HRS): SARS Coronavirus 2: NEGATIVE

## 2018-10-14 ENCOUNTER — Telehealth: Payer: Self-pay | Admitting: *Deleted

## 2018-10-14 NOTE — Telephone Encounter (Signed)
Pt contacted pre-catheterization scheduled at [facility] for: Friday October 16, 2018 9 AM Verified arrival time and place: [place] Entrance A at: 7 AM  Covid-19 test date: 10/13/18  No solid food after midnight prior to cath, clear liquids until 5 AM day of procedure. Contrast allergy: no   AM meds can be  taken pre-cath with sip of water including: ASA 81 mg  Confirmed patient has responsible person to drive home post procedure and observe 24 hours after arriving home: yes  Due to Covid-19 pandemic no visitors are allowed in the hospital (unless cognitive impairment).  Their designated party will be called when their procedure is over for an update and to arrange pick up.  Patients are required to wear a mask when they enter the hospital.      COVID-19 Pre-Screening Questions:  . In the past 7 to 10 days have you had a cough,  shortness of breath, headache, congestion, fever (100 or greater) body aches, chills, sore throat, or sudden loss of taste or sense of smell? no . Have you been around anyone with known Covid 19? no . Have you been around anyone who is awaiting Covid 19 test results in the past 7 to 10 days? no . Have you been around anyone who has been exposed to Covid 19, or has mentioned symptoms of Covid 19 within the past 7 to 10 days? no  I reviewed procedure instructions/mask/visitor/Covid-19 screening questions with patient and his son,Eddie, they verbalized understanding, thanked me for call.

## 2018-10-15 ENCOUNTER — Other Ambulatory Visit: Payer: Self-pay

## 2018-10-15 DIAGNOSIS — I350 Nonrheumatic aortic (valve) stenosis: Secondary | ICD-10-CM

## 2018-10-16 ENCOUNTER — Ambulatory Visit (HOSPITAL_BASED_OUTPATIENT_CLINIC_OR_DEPARTMENT_OTHER): Payer: Medicare Other

## 2018-10-16 ENCOUNTER — Other Ambulatory Visit: Payer: Self-pay

## 2018-10-16 ENCOUNTER — Ambulatory Visit (HOSPITAL_COMMUNITY)
Admission: RE | Admit: 2018-10-16 | Discharge: 2018-10-16 | Disposition: A | Discharge status: Home / Self Care | Payer: Medicare Other | Attending: Cardiovascular Disease | Admitting: Cardiovascular Disease

## 2018-10-16 ENCOUNTER — Encounter (HOSPITAL_COMMUNITY)
Admission: RE | Disposition: A | Discharge status: Home / Self Care | Payer: Self-pay | Source: Home / Self Care | Attending: Cardiovascular Disease

## 2018-10-16 DIAGNOSIS — Z952 Presence of prosthetic heart valve: Secondary | ICD-10-CM | POA: Insufficient documentation

## 2018-10-16 DIAGNOSIS — I350 Nonrheumatic aortic (valve) stenosis: Secondary | ICD-10-CM | POA: Insufficient documentation

## 2018-10-16 DIAGNOSIS — Z79899 Other long term (current) drug therapy: Secondary | ICD-10-CM | POA: Diagnosis not present

## 2018-10-16 DIAGNOSIS — I2510 Atherosclerotic heart disease of native coronary artery without angina pectoris: Secondary | ICD-10-CM | POA: Insufficient documentation

## 2018-10-16 HISTORY — DX: Atherosclerotic heart disease of native coronary artery without angina pectoris: I25.10

## 2018-10-16 HISTORY — PX: INTRAVASCULAR PRESSURE WIRE/FFR STUDY: CATH118243

## 2018-10-16 HISTORY — PX: RIGHT/LEFT HEART CATH AND CORONARY ANGIOGRAPHY: CATH118266

## 2018-10-16 LAB — POCT I-STAT 7, (LYTES, BLD GAS, ICA,H+H)
Bicarbonate: 25.3 mmol/L (ref 20.0–28.0)
Calcium, Ion: 1.28 mmol/L (ref 1.15–1.40)
HCT: 32 % — ABNORMAL LOW (ref 39.0–52.0)
Hemoglobin: 10.9 g/dL — ABNORMAL LOW (ref 13.0–17.0)
O2 Saturation: 90 %
Potassium: 3.9 mmol/L (ref 3.5–5.1)
Sodium: 130 mmol/L — ABNORMAL LOW (ref 135–145)
TCO2: 27 mmol/L (ref 22–32)
pCO2 arterial: 45.1 mmHg (ref 32.0–48.0)
pH, Arterial: 7.358 (ref 7.350–7.450)
pO2, Arterial: 62 mmHg — ABNORMAL LOW (ref 83.0–108.0)

## 2018-10-16 LAB — POCT I-STAT EG7
Acid-Base Excess: 1 mmol/L (ref 0.0–2.0)
Bicarbonate: 26.4 mmol/L (ref 20.0–28.0)
Calcium, Ion: 1.24 mmol/L (ref 1.15–1.40)
HCT: 34 % — ABNORMAL LOW (ref 39.0–52.0)
Hemoglobin: 11.6 g/dL — ABNORMAL LOW (ref 13.0–17.0)
O2 Saturation: 75 %
Potassium: 3.9 mmol/L (ref 3.5–5.1)
Sodium: 130 mmol/L — ABNORMAL LOW (ref 135–145)
TCO2: 28 mmol/L (ref 22–32)
pCO2, Ven: 45 mmHg (ref 44.0–60.0)
pH, Ven: 7.377 (ref 7.250–7.430)
pO2, Ven: 41 mmHg (ref 32.0–45.0)

## 2018-10-16 LAB — POCT ACTIVATED CLOTTING TIME
Activated Clotting Time: 169 seconds
Activated Clotting Time: 208 seconds

## 2018-10-16 LAB — ECHOCARDIOGRAM COMPLETE: Weight: 2368 oz

## 2018-10-16 SURGERY — RIGHT/LEFT HEART CATH AND CORONARY ANGIOGRAPHY
Anesthesia: LOCAL

## 2018-10-16 MED ORDER — SODIUM CHLORIDE 0.9% FLUSH: 3.0000 mL | INTRAVENOUS | Status: DC | PRN

## 2018-10-16 MED ORDER — FENTANYL CITRATE (PF) 100 MCG/2ML IJ SOLN
INTRAMUSCULAR | Status: DC | PRN
  Administered 2018-10-16 (×2): 25 ug via INTRAVENOUS

## 2018-10-16 MED ORDER — MIDAZOLAM HCL 2 MG/2ML IJ SOLN
INTRAMUSCULAR | Status: DC | PRN
  Administered 2018-10-16 (×2): 1 mg via INTRAVENOUS

## 2018-10-16 MED ORDER — SODIUM CHLORIDE 0.9 % WEIGHT BASED INFUSION
3.0000 mL/kg/h | INTRAVENOUS | Status: AC
  Administered 2018-10-16: 3 mL/kg/h via INTRAVENOUS

## 2018-10-16 MED ORDER — ONDANSETRON HCL 4 MG/2ML IJ SOLN: 4.0000 mg | Freq: Four times a day (QID) | INTRAMUSCULAR | Status: DC | PRN

## 2018-10-16 MED ORDER — SODIUM CHLORIDE 0.9% FLUSH: 3.0000 mL | Freq: Two times a day (BID) | INTRAVENOUS | Status: DC

## 2018-10-16 MED ORDER — SODIUM CHLORIDE 0.9 % IV SOLN: 250.0000 mL | INTRAVENOUS | Status: DC | PRN

## 2018-10-16 MED ORDER — HEPARIN (PORCINE) IN NACL 1000-0.9 UT/500ML-% IV SOLN
INTRAVENOUS | Status: DC | PRN
  Administered 2018-10-16 (×2): 500 mL

## 2018-10-16 MED ORDER — IOHEXOL 350 MG/ML SOLN
INTRAVENOUS | Status: DC | PRN
  Administered 2018-10-16: 80 mL via INTRACARDIAC

## 2018-10-16 MED ORDER — HEPARIN (PORCINE) IN NACL 1000-0.9 UT/500ML-% IV SOLN
INTRAVENOUS | Status: AC
  Filled 2018-10-16: qty 1000

## 2018-10-16 MED ORDER — AMLODIPINE BESYLATE 5 MG PO TABS
5.0000 mg | ORAL_TABLET | Freq: Every day | ORAL | Status: DC
  Administered 2018-10-16: 10:00:00 5 mg via ORAL
  Filled 2018-10-16: qty 1

## 2018-10-16 MED ORDER — MIDAZOLAM HCL 2 MG/2ML IJ SOLN
INTRAMUSCULAR | Status: AC
  Filled 2018-10-16: qty 2

## 2018-10-16 MED ORDER — LIDOCAINE HCL (PF) 1 % IJ SOLN
INTRAMUSCULAR | Status: AC
  Filled 2018-10-16: qty 30

## 2018-10-16 MED ORDER — HYDRALAZINE HCL 20 MG/ML IJ SOLN: 10.0000 mg | INTRAMUSCULAR | Status: DC | PRN

## 2018-10-16 MED ORDER — HEPARIN SODIUM (PORCINE) 1000 UNIT/ML IJ SOLN
INTRAMUSCULAR | Status: DC | PRN
  Administered 2018-10-16 (×2): 4000 [IU] via INTRAVENOUS

## 2018-10-16 MED ORDER — ASPIRIN 81 MG PO CHEW: 81.0000 mg | CHEWABLE_TABLET | ORAL | Status: DC

## 2018-10-16 MED ORDER — VERAPAMIL HCL 2.5 MG/ML IV SOLN
INTRAVENOUS | Status: AC
  Filled 2018-10-16: qty 2

## 2018-10-16 MED ORDER — SODIUM CHLORIDE 0.9 % WEIGHT BASED INFUSION: 1.0000 mL/kg/h | INTRAVENOUS | Status: DC

## 2018-10-16 MED ORDER — LABETALOL HCL 5 MG/ML IV SOLN: 10.0000 mg | INTRAVENOUS | Status: DC | PRN

## 2018-10-16 MED ORDER — FENTANYL CITRATE (PF) 100 MCG/2ML IJ SOLN
INTRAMUSCULAR | Status: AC
  Filled 2018-10-16: qty 2

## 2018-10-16 MED ORDER — ACETAMINOPHEN 325 MG PO TABS: 650.0000 mg | ORAL_TABLET | ORAL | Status: DC | PRN

## 2018-10-16 MED ORDER — LIDOCAINE HCL (PF) 1 % IJ SOLN
INTRAMUSCULAR | Status: DC | PRN
  Administered 2018-10-16 (×2): 2 mL

## 2018-10-16 MED ORDER — VERAPAMIL HCL 2.5 MG/ML IV SOLN
INTRAVENOUS | Status: DC | PRN
  Administered 2018-10-16: 10 mL via INTRA_ARTERIAL

## 2018-10-16 SURGICAL SUPPLY — 16 items
CATH 5FR JL3.5 JR4 ANG PIG MP (CATHETERS) ×1 IMPLANT
CATH BALLN WEDGE 5F 110CM (CATHETERS) ×1 IMPLANT
CATH INFINITI 5FR AL1 (CATHETERS) ×1 IMPLANT
CATH LAUNCHER 5F EBU3.5 (CATHETERS) ×1 IMPLANT
DEVICE RAD COMP TR BAND LRG (VASCULAR PRODUCTS) ×1 IMPLANT
GLIDESHEATH SLEND SS 6F .021 (SHEATH) ×1 IMPLANT
GUIDEWIRE INQWIRE 1.5J.035X260 (WIRE) IMPLANT
GUIDEWIRE PRESSURE COMET II (WIRE) ×1 IMPLANT
INQWIRE 1.5J .035X260CM (WIRE) ×2
KIT ESSENTIALS PG (KITS) ×1 IMPLANT
KIT HEART LEFT (KITS) ×2 IMPLANT
PACK CARDIAC CATHETERIZATION (CUSTOM PROCEDURE TRAY) ×2 IMPLANT
SHEATH GLIDE SLENDER 4/5FR (SHEATH) ×1 IMPLANT
TRANSDUCER W/STOPCOCK (MISCELLANEOUS) ×2 IMPLANT
TUBING CIL FLEX 10 FLL-RA (TUBING) ×2 IMPLANT
WIRE EMERALD 3MM-J .025X260CM (WIRE) ×1 IMPLANT

## 2018-10-16 NOTE — Interval H&P Note (Signed)
History and Physical Interval Note:  10/16/2018 11:05 AM  Stephen Wolf  has presented today for surgery, with the diagnosis of arotic stenosis.  The various methods of treatment have been discussed with the patient and family. After consideration of risks, benefits and other options for treatment, the patient has consented to  Procedure(s): RIGHT/LEFT HEART CATH AND CORONARY ANGIOGRAPHY (N/A) as a surgical intervention.  The patient's history has been reviewed, patient examined, no change in status, stable for surgery.  I have reviewed the patient's chart and labs.  Questions were answered to the patient's satisfaction.     Sherren Mocha

## 2018-10-16 NOTE — Progress Notes (Signed)
Bleeding noted at right radial site and air re-instilled to 12cc

## 2018-10-16 NOTE — Progress Notes (Signed)
Echocardiogram 2D Echocardiogram has been performed.  [place] Deputy Emrie Gayle 10/16/2018, 3:16 PM

## 2018-10-16 NOTE — Discharge Instructions (Signed)
Radial Site Care ° °This sheet gives you information about how to care for yourself after your procedure. Your health care provider may also give you more specific instructions. If you have problems or questions, contact your health care provider. °What can I expect after the procedure? °After the procedure, it is common to have: °· Bruising and tenderness at the catheter insertion area. °Follow these instructions at home: °Medicines °· Take over-the-counter and prescription medicines only as told by your health care provider. °Insertion site care °· Follow instructions from your health care provider about how to take care of your insertion site. Make sure you: °? Wash your hands with soap and water before you change your bandage (dressing). If soap and water are not available, use hand sanitizer. °? Change your dressing as told by your health care provider. °? Leave stitches (sutures), skin glue, or adhesive strips in place. These skin closures may need to stay in place for 2 weeks or longer. If adhesive strip edges start to loosen and curl up, you may trim the loose edges. Do not remove adhesive strips completely unless your health care provider tells you to do that. °· Check your insertion site every day for signs of infection. Check for: °? Redness, swelling, or pain. °? Fluid or blood. °? Pus or a bad smell. °? Warmth. °· Do not take baths, swim, or use a hot tub until your health care provider approves. °· You may shower 24-48 hours after the procedure, or as directed by your health care provider. °? Remove the dressing and gently wash the site with plain soap and water. °? Pat the area dry with a clean towel. °? Do not rub the site. That could cause bleeding. °· Do not apply powder or lotion to the site. °Activity ° °· For 24 hours after the procedure, or as directed by your health care provider: °? Do not flex or bend the affected arm. °? Do not push or pull heavy objects with the affected arm. °? Do not  drive yourself home from the hospital or clinic. You may drive 24 hours after the procedure unless your health care provider tells you not to. °? Do not operate machinery or power tools. °· Do not lift anything that is heavier than 10 lb (4.5 kg), or the limit that you are told, until your health care provider says that it is safe. °· Ask your health care provider when it is okay to: °? Return to work or school. °? Resume usual physical activities or sports. °? Resume sexual activity. °General instructions °· If the catheter site starts to bleed, raise your arm and put firm pressure on the site. If the bleeding does not stop, get help right away. This is a medical emergency. °· If you went home on the same day as your procedure, a responsible adult should be with you for the first 24 hours after you arrive home. °· Keep all follow-up visits as told by your health care provider. This is important. °Contact a health care provider if: °· You have a fever. °· You have redness, swelling, or yellow drainage around your insertion site. °Get help right away if: °· You have unusual pain at the radial site. °· The catheter insertion area swells very fast. °· The insertion area is bleeding, and the bleeding does not stop when you hold steady pressure on the area. °· Your arm or hand becomes pale, cool, tingly, or numb. °These symptoms may represent a serious problem   that is an emergency. Do not wait to see if the symptoms will go away. Get medical help right away. Call your local emergency services (911 in the [place] Do not drive yourself to the hospital. °Summary °· After the procedure, it is common to have bruising and tenderness at the site. °· Follow instructions from your health care provider about how to take care of your radial site wound. Check the wound every day for signs of infection. °· Do not lift anything that is heavier than 10 lb (4.5 kg), or the limit that you are told, until your health care provider says  that it is safe. °This information is not intended to replace advice given to you by your health care provider. Make sure you discuss any questions you have with your health care provider. °Document Released: 05/11/2010 Document Revised: 05/14/2017 Document Reviewed: 05/14/2017 °Elsevier Interactive Patient Education © 2019 [employer] ° ° ° °Moderate Conscious Sedation, Adult, Care After °These instructions provide you with information about caring for yourself after your procedure. Your health care provider may also give you more specific instructions. Your treatment has been planned according to current medical practices, but problems sometimes occur. Call your health care provider if you have any problems or questions after your procedure. °What can I expect after the procedure? °After your procedure, it is common: °· To feel sleepy for several hours. °· To feel clumsy and have poor balance for several hours. °· To have poor judgment for several hours. °· To vomit if you eat too soon. °Follow these instructions at home: °For at least 24 hours after the procedure: ° °· Do not: °? Participate in activities where you could fall or become injured. °? Drive. °? Use heavy machinery. °? Drink alcohol. °? Take sleeping pills or medicines that cause drowsiness. °? Make important decisions or sign legal documents. °? Take care of children on your own. °· Rest. °Eating and drinking °· Follow the diet recommended by your health care provider. °· If you vomit: °? Drink water, juice, or soup when you can drink without vomiting. °? Make sure you have little or no nausea before eating solid foods. °General instructions °· Have a responsible adult stay with you until you are awake and alert. °· Take over-the-counter and prescription medicines only as told by your health care provider. °· If you smoke, do not smoke without supervision. °· Keep all follow-up visits as told by your health care provider. This is  important. °Contact a health care provider if: °· You keep feeling nauseous or you keep vomiting. °· You feel light-headed. °· You develop a rash. °· You have a fever. °Get help right away if: °· You have trouble breathing. °This information is not intended to replace advice given to you by your health care provider. Make sure you discuss any questions you have with your health care provider. °Document Released: 01/27/2013 Document Revised: 09/11/2015 Document Reviewed: 07/29/2015 °Elsevier Interactive Patient Education © 2019 [employer] ° °

## 2018-10-19 ENCOUNTER — Encounter (HOSPITAL_COMMUNITY): Payer: Self-pay | Admitting: Cardiovascular Disease

## 2018-10-26 ENCOUNTER — Ambulatory Visit (HOSPITAL_COMMUNITY)
Admission: RE | Admit: 2018-10-26 | Discharge: 2018-10-26 | Disposition: A | Discharge status: Home / Self Care | Payer: Medicare Other | Source: Ambulatory Visit | Attending: Cardiovascular Disease | Admitting: Cardiovascular Disease

## 2018-10-26 ENCOUNTER — Other Ambulatory Visit: Payer: Self-pay

## 2018-10-26 ENCOUNTER — Ambulatory Visit (HOSPITAL_BASED_OUTPATIENT_CLINIC_OR_DEPARTMENT_OTHER)
Admission: RE | Admit: 2018-10-26 | Discharge: 2018-10-26 | Disposition: A | Discharge status: Home / Self Care | Payer: Medicare Other | Source: Ambulatory Visit | Attending: Cardiovascular Disease | Admitting: Cardiovascular Disease

## 2018-10-26 DIAGNOSIS — I350 Nonrheumatic aortic (valve) stenosis: Secondary | ICD-10-CM

## 2018-10-26 MED ORDER — IOHEXOL 350 MG/ML SOLN
100.0000 mL | Freq: Once | INTRAVENOUS | Status: AC | PRN
  Administered 2018-10-26: 100 mL via INTRAVENOUS

## 2018-10-26 NOTE — Progress Notes (Signed)
Carotid duplex completed. Preliminary results in Chart review CV Proc. [facility], [place] 10/26/2018, 9:03 AM

## 2018-11-02 ENCOUNTER — Encounter: Payer: Medicare Other | Admitting: Thoracic Surgery (Cardiothoracic Vascular Surgery)

## 2018-11-09 ENCOUNTER — Ambulatory Visit: Payer: Medicare Other | Attending: Cardiovascular Disease | Admitting: Physical Therapy

## 2018-11-09 ENCOUNTER — Encounter: Payer: Self-pay | Admitting: Physical Therapy

## 2018-11-09 ENCOUNTER — Other Ambulatory Visit: Payer: Self-pay

## 2018-11-09 ENCOUNTER — Encounter: Payer: Self-pay | Admitting: Thoracic Surgery (Cardiothoracic Vascular Surgery)

## 2018-11-09 ENCOUNTER — Ambulatory Visit (INDEPENDENT_AMBULATORY_CARE_PROVIDER_SITE_OTHER): Payer: Medicare Other | Admitting: Thoracic Surgery (Cardiothoracic Vascular Surgery)

## 2018-11-09 VITALS — BP 127/69 | HR 79 | Temp 97.7°F | Resp 18 | Ht 68.5 in | Wt 140.0 lb

## 2018-11-09 DIAGNOSIS — R2689 Other abnormalities of gait and mobility: Secondary | ICD-10-CM | POA: Insufficient documentation

## 2018-11-09 DIAGNOSIS — I2510 Atherosclerotic heart disease of native coronary artery without angina pectoris: Secondary | ICD-10-CM

## 2018-11-09 DIAGNOSIS — I350 Nonrheumatic aortic (valve) stenosis: Secondary | ICD-10-CM | POA: Diagnosis not present

## 2018-11-09 NOTE — Therapy (Signed)
Stephen Wolf, [place], [address] Phone: [phone]   Fax:  [phone]  Physical Therapy Evaluation  Patient Details  Name: Stephen Wolf MRN: [id number] Date of Birth: Jul 30, 1925 Referring Provider (PT): Dr Sherren Mocha    Encounter Date: 11/09/2018  PT End of Session - 11/09/18 1308    Visit Number  1    Number of Visits  1    Date for PT Re-Evaluation  11/16/18    PT Start Time  1130    PT Stop Time  1201    PT Time Calculation (min)  31 min    Activity Tolerance  Patient tolerated treatment well    Behavior During Therapy  [facility] for tasks assessed/performed       Past Medical History:  Diagnosis Date  . Anemia    iron defiency  . Cancer ([place])    1997  . Coronary artery disease involving left main coronary artery 10/16/2018  . Hyperlipidemia    MIXED  . Hypertension   . Osteopenia   . PAD (peripheral artery disease) ([place])   . Severe aortic stenosis   . Thoracic aortic ectasia ([place])   . Vitamin D deficiency     Past Surgical History:  Procedure Laterality Date  . COLONOSCOPY  08/2009   SCREENING..HYPLASTIC POLYPECTOMY..DR. DARRELUS  . INTRAVASCULAR PRESSURE WIRE/FFR STUDY N/A 10/16/2018   Procedure: INTRAVASCULAR PRESSURE WIRE/FFR STUDY;  Surgeon: Sherren Mocha, MD;  Location: [facility];  Service: Cardiovascular;  Laterality: N/A;  . RIGHT/LEFT HEART CATH AND CORONARY ANGIOGRAPHY N/A 10/16/2018   Procedure: RIGHT/LEFT HEART CATH AND CORONARY ANGIOGRAPHY;  Surgeon: Sherren Mocha, MD;  Location: [facility];  Service: Cardiovascular;  Laterality: N/A;    There were no vitals filed for this visit.   Subjective Assessment - 11/09/18 1136    Subjective  In January the patien began having shortness of breath and fatigue. At this point he can only make it to the end of his friveway before he becomes fatigued. [place] the mall.    Currently in Pain?  Yes    Pain  Score  2     Pain Location  Hip    Pain Orientation  Right    Pain Descriptors / Indicators  Aching    Pain Type  Chronic pain    Pain Onset  More than a month ago    Pain Frequency  Constant    Aggravating Factors   sitting    Pain Relieving Factors  walking makes it feel better    Multiple Pain Sites  Yes         [facility] PT Assessment - 11/09/18 0001      Assessment   Medical Diagnosis  Severe Aortic Stenosis     Referring Provider (PT)  Dr Sherren Mocha     Onset Date/Surgical Date  --   January 2020   Hand Dominance  Right    Next MD Visit  Today     Prior Therapy  none       Precautions   Precautions  None      Restrictions   Weight Bearing Restrictions  No      Balance Screen   Has the patient fallen in the past 6 months  No    Has the patient had a decrease in activity level because of a fear of falling?   No    Is the patient reluctant to leave  their home because of a fear of falling?   No      Home Environment   Living Environment  Private residence    Additional Comments  10 steps to get upstairs to his room       Prior Function   Level of Independence  Independent    Vocation  Retired      Associate Professor   Overall Cognitive Status  Within Functional Limits for tasks assessed    Attention  Focused    Focused Attention  Appears intact    Memory  Appears intact    Awareness  Appears intact    Problem Solving  Appears intact      Sensation   Light Touch  Appears Intact    Additional Comments  denies parathesias       Coordination   Gross Motor Movements are Fluid and Coordinated  Yes    Fine Motor Movements are Fluid and Coordinated  Yes      ROM / Strength   AROM / PROM / Strength  AROM;PROM;Strength      AROM   Overall AROM Comments  full UE and LE AROM       PROM   Overall PROM Comments  full UE/LE PROM       Strength   Strength Assessment Site  Hand    Right/Left hand  Right;Left    Right Hand Grip (lbs)  45    Left Hand Grip (lbs)  40       6 minute walk test results    Aerobic Endurance Distance Walked  380    Endurance additional comments  2 seated rest breaks required 78% limitation       [facility] Pre-Surgical Assessment - 11/09/18 0001    5 Meter Walk Test- trial 1  12 sec    5 Meter Walk Test- trial 2  12 sec.     5 Meter Walk Test- trial 3  11 sec.    5 meter walk test average  11.67 sec    4 Stage Balance Test tolerated for:   10 sec.    4 Stage Balance Test Position  2    comment  could not come to full tandem stance     Sit To Stand Test- trial 1  5 sec.    Comment  10 sec     ADL/IADL Independent with:  Bathing;Dressing;Meal prep    ADL/IADL Needs Assistance with:  Yard work    6 Minute Walk- Baseline  yes    BP (mmHg)  159/82   Son expressed this is higher then usual.    HR (bpm)  79    02 Sat (%RA)  99 %    Modified Borg Scale for Dyspnea  0- Nothing at all    Perceived Rate of Exertion (Borg)  6-    6 Minute Walk Post Test  yes    BP (mmHg)  137/71    HR (bpm)  88    02 Sat (%RA)  98 %    Modified Borg Scale for Dyspnea  3- Moderate shortness of breath or breathing difficulty    Perceived Rate of Exertion (Borg)  13- Somewhat hard              Objective measurements completed on examination: See above findings.              PT Education - 11/09/18 1307    Education Details  purpose of testing  Person(s) Educated  Patient    Methods  Explanation;Demonstration    Comprehension  Verbalized understanding;Returned demonstration;Verbal cues required;Tactile cues required                  Plan - 11/09/18 1309    Clinical Impression Statement  See below    Stability/Clinical Decision Making  Stable/Uncomplicated    Clinical Decision Making  Low    Rehab Potential  Good    PT Frequency  One time visit    PT Treatment/Interventions  Patient/family education    Consulted and Agree with Plan of Care  Patient       Clinical Impression Statement: Pt is a 85 yo male  presenting to OP PT for evaluation prior to possible TAVR surgery due to severe aortic stenosis. Pt reports onset of dyspnea with ambulation and chronic fatigue approximately 7 months ago. Symptoms are limiting ability to ambulate in his community.[name] Pt presents with normal ROM and strength (decreased right hip strength, decreased balance and is high at high fall risk 4 stage balance test, decreased  walking speed and decreased aerobic endurance per 6 minute walk test. Pt ambulated 170 feet in 2:10 before requesting a seated rest beak lasting 1:20. At time of rest, patient's HR was 80 bpm and O2 was 96 on room air. Pt reported 5/10 shortness of breath on modified scale for dyspnea. Pt able to resume after rest and ambulate an additional 110 feet. Before requiring a second seated rest break of 1 minute. He was then able to ambulate another 100 feet. Pt ambulated a total of 380  feet in 6 minute walk final HR 88 and Sao2 97%. B/Pdid not increased significantly with 6 minute walk test but baseline was elevated per patients son. He reports he can get some office anxiety. . Based on the Short Physical Performance Battery, patient has a frailty rating of 5/12 with </= 5/12 considered frail.    Patient will benefit from skilled therapeutic intervention in order to improve the following deficits and impairments:  Abnormal gait, Decreased balance, Decreased endurance  Visit Diagnosis: 1. Other abnormalities of gait and mobility        Problem List Patient Active Problem List   Diagnosis Date Noted  . Coronary artery disease involving left main coronary artery 10/16/2018  . Osteopenia   . Vitamin D deficiency   . PAD (peripheral artery disease) ([place])   . Thoracic aortic ectasia ([place])   . Anemia   . Aortic stenosis     Carney Living PT DPT  11/09/2018, 5:59 PM  [facility] [address] [place], [place], [address] Phone: [phone]   Fax:   [phone]  Name: CAIDON FOTI MRN: [id number] Date of Birth: 09-03-1925

## 2018-11-09 NOTE — Progress Notes (Addendum)
[facility]  MULTIDISCIPLINARY HEART VALVE CLINIC  CARDIOTHORACIC SURGERY CONSULTATION REPORT  Referring Provider is Secundino Ginger, PA-C PCP is Secundino Ginger, PA-C  Chief Complaint  Patient presents with   Aortic Stenosis    Further discuss TAVR surgery    HPI:  Patient is a 85 year old male with history of aortic stenosis, hypertension, hyperlipidemia, iron deficient anemia, and thoracic aortic ectasia who returns to the office today for follow-up of severe symptomatic aortic stenosis.  Patient was initially referred for surgical consultation approximately 1 year ago and evaluated on November 03, 2017.  Echocardiogram performed at that time revealed normal left ventricular systolic function with hemodynamic findings suggestive of moderate aortic stenosis.  At that time the patient denied any symptoms of exertional shortness of breath or chest discomfort.  We elected to follow the patient carefully.  Over the past year the patient has developed worsening fatigue, generalized weakness, exertional shortness of breath, loss of appetite, weight loss, and occasional dizzy spells.  Follow-up echocardiogram performed at [facility] Aug 31, 2018 revealed significant progression and severity of aortic stenosis with peak velocity across the aortic valve measured 3.9 m/s corresponding to mean transvalvular gradient estimated 38 mmHg.  I last saw the patient in the office on September 21, 2018 and discussed these findings at length with the patient and his son.  We subsequently made a decision to proceed with further diagnostic testing to consider transcatheter aortic valve replacement.  The patient underwent transthoracic echocardiogram and left and right heart catheterization on October 16, 2018.  Both echocardiogram and diagnostic cardiac catheterization confirmed the presence of aortic stenosis but also revealed the presence of significant coronary artery disease including 100% chronic  occlusion of the right coronary artery with 50 to 60% stenosis of the left main coronary artery.  Right heart pressures were normal.  CT angiography was performed and the patient returns to the office today to review the results of these tests and discuss treatment options further.  The patient is widowed and lives in [place] with his son.  Up until recently he has remained remarkably healthy and physically active for a gentleman his age.  He reports that over the past 6 months to a year he has declined significantly.  His primary complaint is that of decreased energy and progressive fatigue.  He gets short of breath with exertion.  He has lost his appetite and his weight is down approximately 10 pounds.  He denies resting shortness of breath, PND, orthopnea, or lower extremity edema.  He has had occasional slight dizzy spells without syncope.  He has never had any chest pain or chest pressure either with activity or at rest.  He complains that he may be getting a little bit more forgetful than he had been in the past, although this has not become problematic from a practical standpoint.  Last week the patient was seen in follow-up by Roque Cash, PA-C and placed on oral ciprofloxacin for a possible urinary tract infection.  Patient states that her urine specimen was obtained and reportedly abnormal.  Details are not currently available.  Past Medical History:  Diagnosis Date   Anemia    iron defiency   Aortic stenosis    Cancer ([facility])    1997   Coronary artery disease involving left main coronary artery 10/16/2018   Echocardiogram abnormal 09/19/2016   H/O bone density study 12/01/2014   F/U DUE 11/30/16   History of nuclear stress test 06/07/2014  Hyperlipidemia    MIXED   Hypertension    Osteopenia    PAD (peripheral artery disease) (HCC)    Thoracic aortic ectasia (HCC)    Vitamin D deficiency     Past Surgical History:  Procedure Laterality Date   COLONOSCOPY  08/2009     SCREENING..HYPLASTIC POLYPECTOMY..DR. DARRELUS   INTRAVASCULAR PRESSURE WIRE/FFR STUDY N/A 10/16/2018   Procedure: INTRAVASCULAR PRESSURE WIRE/FFR STUDY;  Surgeon: Sherren Mocha, MD;  Location: [facility];  Service: Cardiovascular;  Laterality: N/A;   RIGHT/LEFT HEART CATH AND CORONARY ANGIOGRAPHY N/A 10/16/2018   Procedure: RIGHT/LEFT HEART CATH AND CORONARY ANGIOGRAPHY;  Surgeon: Sherren Mocha, MD;  Location: [facility];  Service: Cardiovascular;  Laterality: N/A;    Family History  Problem Relation Age of Onset   Hyperlipidemia Sister    Hypertension Sister    Heart disease Sister    Hyperlipidemia Brother    Hypertension Brother    Heart disease Brother     Social History   Socioeconomic History   Marital status: Widowed    Spouse name: Not on file   Number of children: 1   Years of education: Not on file   Highest education level: Not on file  Occupational History   Occupation: retired    Comment: Advertising account planner strain: Not on file   Food insecurity    Worry: Not on file    Inability: Not on file   Transportation needs    Medical: Not on file    Non-medical: Not on file  Tobacco Use   Smoking status: Never Smoker   Smokeless tobacco: Never Used  Substance and Sexual Activity   Alcohol use: No   Drug use: No   Sexual activity: Never  Lifestyle   Physical activity    Days per week: Not on file    Minutes per session: Not on file   Stress: Not on file  Relationships   Social connections    Talks on phone: Not on file    Gets together: Not on file    Attends religious service: Not on file    Active member of club or organization: Not on file    Attends meetings of clubs or organizations: Not on file    Relationship status: Not on file   Intimate partner violence    Fear of current or ex partner: Not on file    Emotionally abused: Not on file    Physically abused: Not on  file    Forced sexual activity: Not on file  Other Topics Concern   Not on file  Social History Narrative   Lives with is son    Current Outpatient Medications  Medication Sig Dispense Refill   ALPRAZolam (XANAX) 0.25 MG tablet Take 0.25 mg by mouth at bedtime.     amLODipine (NORVASC) 5 MG tablet Take 5 mg by mouth 2 (two) times a day.      docusate sodium (COLACE) 100 MG capsule Take 100 mg by mouth daily.     finasteride (PROSCAR) 5 MG tablet Take 5 mg by mouth daily.     Iron-FA-B Cmp-C-Biot-Probiotic (FUSION PLUS PO) Take 1 capsule by mouth daily.     losartan (COZAAR) 50 MG tablet Take 50 mg by mouth daily.     Multiple Vitamin (MULTIVITAMIN) tablet Take 1 tablet by mouth daily.     Omega-3 Fatty Acids (FISH OIL) 1000 MG CAPS Take 1 capsule by mouth daily.  omeprazole (PRILOSEC) 20 MG capsule Take 20 mg by mouth daily.     psyllium (METAMUCIL) 58.6 % packet Take 1 packet by mouth daily.     simvastatin (ZOCOR) 20 MG tablet Take 20 mg by mouth at bedtime.      No current facility-administered medications for this visit.     Allergies  Allergen Reactions   Codeine Phosphate [Codeine] Shortness Of Breath   Sulfa Antibiotics Shortness Of Breath   Asa [Aspirin] Other (See Comments)    MILD ANEMIA      Review of Systems:   General:  decreased appetite, decreased energy, no weight gain, + weight loss, no fever  Cardiac:  no chest pain with exertion, no chest pain at rest, +SOB with exertion, no resting SOB, no PND, no orthopnea, no palpitations, no arrhythmia, no atrial fibrillation, no LE edema, + dizzy spells, no syncope  Respiratory:  + exertional shortness of breath, no home oxygen, no productive cough, no dry cough, no bronchitis, no wheezing, no hemoptysis, no asthma, no pain with inspiration or cough, no sleep apnea, no CPAP at night  GI:   no difficulty swallowing, no reflux, no frequent heartburn, no hiatal hernia, no abdominal pain, no constipation,  no diarrhea, no hematochezia, no hematemesis, no melena  GU:   no dysuria,  + frequency, possible ongoing urinary tract infection, + microscopic hematuria, + enlarged prostate, no kidney stones, no kidney disease  Vascular:  no pain suggestive of claudication, no pain in feet, no leg cramps, no varicose veins, no DVT, no non-healing foot ulcer  Neuro:   no stroke, no TIA's, no seizures, no headaches, no temporary blindness one eye,  no slurred speech, no peripheral neuropathy, no chronic pain, no instability of gait, + mild memory/cognitive dysfunction  Musculoskeletal: no arthritis, no joint swelling, no myalgias, no difficulty walking, normal mobility   Skin:   no rash, no itching, no skin infections, no pressure sores or ulcerations  Psych:   no anxiety, no depression, no nervousness, no unusual recent stress  Eyes:   no blurry vision, no floaters, no recent vision changes, + wears glasses or contacts  ENT:   no hearing loss, no loose or painful teeth, partial dentures, last saw dentist < 6 months ago  Hematologic:  no easy bruising, no abnormal bleeding, no clotting disorder, no frequent epistaxis  Endocrine:  no diabetes, does not check CBG's at home           Physical Exam:   BP 127/69 (BP Location: Left Arm, Patient Position: Sitting, Cuff Size: Normal)    Pulse 79    Temp 97.7 F (36.5 C)    Resp 18    Ht 5' 8.5" (1.74 m)    Wt 140 lb (63.5 kg)    SpO2 96% Comment: RA   BMI 20.98 kg/m   General:  Elderly and somewhat frail-appearing  HEENT:  Unremarkable   Neck:   no JVD, no bruits, no adenopathy   Chest:   clear to auscultation, symmetrical breath sounds, no wheezes, no rhonchi   CV:   RRR, grade III/VI crescendo/decrescendo murmur heard best at RSB,  no diastolic murmur  Abdomen:  soft, non-tender, no masses   Extremities:  warm, well-perfused, pulses diminished but palpable, no LE edema  Rectal/GU  Deferred  Neuro:   Grossly non-focal and symmetrical  throughout  Skin:   Clean and dry, no rashes, no breakdown   Diagnostic Tests:    ECHOCARDIOGRAM REPORT  Patient Name:   Stephen Wolf Date of Exam: 10/16/2018 Medical Rec #:  [id number]        Height:       68.5 in Accession #:    [id number]       Weight:       148.0 lb Date of Birth:  11/07/1925        BSA:          1.81 m Patient Age:    30 years         BP:           149/83 mmHg Patient Gender: M                HR:           75 bpm. Exam Location:  Inpatient    Procedure: 2D Echo, Cardiac Doppler and Color Doppler  Indications:    Aortic Stenosis (pre-TAVR)   History:        Patient has prior history of Echocardiogram examinations, most                 recent 09/22/2018. Aortic Valve Disease.   Sonographer:    Raquel Sarna Senior Referring Phys: [place]    1. The left ventricle has normal systolic function with an ejection fraction of 60-65%. The cavity size was normal. There is mildly increased left ventricular wall thickness. Left ventricular diastolic Doppler parameters are consistent with impaired  relaxation. No evidence of left ventricular regional wall motion abnormalities.  2. The right ventricle has normal systolic function. The cavity was normal. There is no increase in right ventricular wall thickness.  3. Left atrial size was mildly dilated.  4. Mild calcification of the mitral valve leaflet. There is moderate mitral annular calcification present. No evidence of mitral valve stenosis. No significant mitral regurgitation.  5. The aortic valve is tricuspid. Severe calcifcation of the aortic valve. Aortic valve stenosis is moderate to severe. Mean gradient 32 mmHg, within moderate range, but calculated AVA 0.92 cm^2 suggests that stenosis could be more severe.  6. The aortic root is normal in size and structure.  7. The IVC was not visualized. Peak RA-RV gradient 28 mmHg.  FINDINGS  Left Ventricle: The left ventricle has normal  systolic function, with an ejection fraction of 60-65%. The cavity size was normal. There is mildly increased left ventricular wall thickness. Left ventricular diastolic Doppler parameters are consistent  with impaired relaxation. No evidence of left ventricular regional wall motion abnormalities..  Right Ventricle: The right ventricle has normal systolic function. The cavity was normal. There is no increase in right ventricular wall thickness.  Left Atrium: Left atrial size was mildly dilated.  Right Atrium: Right atrial size was normal in size.  Interatrial Septum: No atrial level shunt detected by color flow Doppler.  Pericardium: There is no evidence of pericardial effusion.  Mitral Valve: The mitral valve is normal in structure. Mild calcification of the mitral valve leaflet. There is moderate mitral annular calcification present. Mitral valve regurgitation is not visualized by color flow Doppler. No evidence of mitral valve  stenosis.  Tricuspid Valve: The tricuspid valve is normal in structure. Tricuspid valve regurgitation is trivial by color flow Doppler.  Aortic Valve: The aortic valve is tricuspid Severe calcifcation of the aortic valve. Aortic valve regurgitation was not visualized by color flow Doppler.  Pulmonic Valve: The pulmonic valve was normal in structure. Pulmonic valve regurgitation is not visualized by color flow Doppler.  Aorta: The aortic root is normal in size and structure.  Venous: The inferior vena cava was not well visualized.    +--------------+--------++  LEFT VENTRICLE            +----------------+---------++ +--------------+--------++  Diastology                    PLAX 2D                   +----------------+---------++ +--------------+--------++  LV e' lateral:   7.07 cm/s    LVIDd:         3.90 cm    +----------------+---------++ +--------------+--------++  LV E/e' lateral: 12.7         LVIDs:         2.90 cm     +----------------+---------++ +--------------+--------++  LV e' medial:    6.09 cm/s    LV PW:         1.00 cm    +----------------+---------++ +--------------+--------++  LV E/e' medial:  14.8         LV IVS:        1.30 cm    +----------------+---------++ +--------------+--------++  LVOT diam:     1.90 cm    +--------------+--------++  LV SV:         34 ml      +--------------+--------++  LV SV Index:   18.70      +--------------+--------++  LVOT Area:     2.84 cm   +--------------+--------++                            +--------------+--------++  +---------------+---------++  RIGHT VENTRICLE             +---------------+---------++  RV S prime:     9.79 cm/s   +---------------+---------++  TAPSE (M-mode): 2.1 cm      +---------------+---------++  +---------------+-------++-----------++  LEFT ATRIUM              Index         +---------------+-------++-----------++  LA diam:        3.90 cm  2.16 cm/m    +---------------+-------++-----------++  LA Vol (A2C):   58.3 ml  32.24 ml/m   +---------------+-------++-----------++  LA Vol (A4C):   60.5 ml  33.46 ml/m   +---------------+-------++-----------++  LA Biplane Vol: 61.2 ml  33.85 ml/m   +---------------+-------++-----------++ +------------+---------++-----------++  RIGHT ATRIUM            Index         +------------+---------++-----------++  RA Area:     17.20 cm                +------------+---------++-----------++  RA Volume:   42.60 ml   23.56 ml/m   +------------+---------++-----------++  +------------------+------------++  AORTIC VALVE                      +------------------+------------++  AV Area (Vmax):    1.05 cm       +------------------+------------++  AV Area (Vmean):   0.88 cm       +------------------+------------++  AV Area (VTI):     0.92 cm       +------------------+------------++  AV Vmax:           345.00 cm/s    +------------------+------------++  AV Vmean:          272.000  cm/s   +------------------+------------++  AV VTI:  0.910 m        +------------------+------------++  AV Peak Grad:      47.6 mmHg      +------------------+------------++  AV Mean Grad:      32.0 mmHg      +------------------+------------++  LVOT Vmax:         128.00 cm/s    +------------------+------------++  LVOT Vmean:        84.500 cm/s    +------------------+------------++  LVOT VTI:          0.294 m        +------------------+------------++  LVOT/AV VTI ratio: 0.32           +------------------+------------++   +-------------+-------++  AORTA                   +-------------+-------++  Ao Root diam: 3.20 cm   +-------------+-------++  +--------------+----------++  +---------------+-----------++  MITRAL VALVE                  TRICUSPID VALVE               +--------------+----------++  +---------------+-----------++  MV Area (PHT): 2.42 cm       TR Peak grad:   28.3 mmHg     +--------------+----------++  +---------------+-----------++  MV Peak grad:  6.2 mmHg       TR Vmax:        266.00 cm/s   +--------------+----------++  +---------------+-----------++  MV Mean grad:  2.0 mmHg     +--------------+----------++  +--------------+-------+  MV Vmax:       1.25 m/s       SHUNTS                  +--------------+----------++  +--------------+-------+  MV Vmean:      73.7 cm/s      Systemic VTI:  0.29 m   +--------------+----------++  +--------------+-------+  MV VTI:        0.35 m         Systemic Diam: 1.90 cm  +--------------+----------++  +--------------+-------+  MV PHT:        90.77 msec   +--------------+----------++  MV Decel Time: 313 msec     +--------------+----------++ +--------------+-----------++  MV E velocity: 90.00 cm/s    +--------------+-----------++  MV A velocity: 125.00 cm/s   +--------------+-----------++  MV E/A ratio:  0.72          +--------------+-----------++    Loralie Champagne MD Electronically signed by Loralie Champagne  MD Signature Date/Time: 10/16/2018/6:01:40 PM       INTRAVASCULAR PRESSURE WIRE/FFR STUDY  RIGHT/LEFT HEART CATH AND CORONARY ANGIOGRAPHY  Conclusion  1. Multivessel CAD with ostial occlusion of the RCA, collateralized with L--->R collaterals, moderate stenosis of the left main, moderate stenosis of the proximal LCx, and nonobstructive stenosis of the LAD 2. Positive DFR of the left main/LCx = 0.84 (Positive less than 0.89) 3. Aortic stenosis with mean transvalvular gradient 17 mmhg, peak instantaneous gradient 28 mmHg  Plan: review films with Dr Roxy Manns to determine best treatment option for aortic stenosis and multivessel CAD, check 2D echo  Indications  Severe aortic stenosis [I35.0 (ICD-10-CM)]  Procedural Details  Technical Details INDICATION: Severe symptomatic aortic stenosis. Pt referred for R/L heart catheterization for evaluation of progressive symptomatic aortic stenosis. No past hx of CAD or prior cath.  PROCEDURAL DETAILS: There was an indwelling IV in a left antecubital vein. Using normal sterile technique, the IV was changed out for a 5 Fr brachial sheath over a 0.018  inch wire. The right wrist was then prepped, draped, and anesthetized with 1% lidocaine. Using the modified Seldinger technique a 5/6 French Slender sheath was placed in the right radial artery. Intra-arterial verapamil was administered through the radial artery sheath. IV heparin was administered after a JR4 catheter was advanced into the central aorta. A Swan-Ganz catheter was used for the right heart catheterization. Standard protocol was followed for recording of right heart pressures and sampling of oxygen saturations. Fick cardiac output was calculated. Standard Judkins catheters were used for selective coronary angiography. LV pressure is recorded and an aortic valve pullback is performed. Pressure wire analysis of the left main/LCx was performed after administration of additional heparin. A Comet Wire is  used and DFR is performed. There were no immediate procedural complications. The patient was transferred to the post catheterization recovery area for further monitoring.    Estimated blood loss <50 mL.   During this procedure medications were administered to achieve and maintain moderate conscious sedation while the patient's heart rate, blood pressure, and oxygen saturation were continuously monitored and I was present face-to-face 100% of this time.  Medications (Filter: Administrations occurring from 10/16/18 1048 to 10/16/18 1209) (important)  Continuous medications are totaled by the amount administered until 10/16/18 1209.  Medication Rate/Dose/Volume Action  Date Time   fentaNYL (SUBLIMAZE) injection (mcg) 25 mcg Given 10/16/18 1110   Total dose as of 10/16/18 1209 25 mcg Given 1129   50 mcg        midazolam (VERSED) injection (mg) 1 mg Given 10/16/18 1110   Total dose as of 10/16/18 1209 1 mg Given 1129   2 mg        lidocaine (PF) (XYLOCAINE) 1 % injection (mL) 2 mL Given 10/16/18 1115   Total dose as of 10/16/18 1209 2 mL Given 1128   4 mL        Radial Cocktail/Verapamil only (mL) 10 mL Given 10/16/18 1129   Total dose as of 10/16/18 1209        10 mL        heparin injection (Units) 4,000 Units Given 10/16/18 1133   Total dose as of 10/16/18 1209 4,000 Units Given 1148   8,000 Units        Heparin (Porcine) in NaCl 1000-0.9 UT/500ML-% SOLN (mL) 500 mL Given 10/16/18 1133   Total dose as of 10/16/18 1209 500 mL Given 1133   1,000 mL        iohexol (OMNIPAQUE) 350 MG/ML injection (mL) 80 mL Given 10/16/18 1159   Total dose as of 10/16/18 1209        80 mL        Sedation Time  Sedation Time Physician-1: 42 minutes 59 seconds  Coronary Findings  Diagnostic Dominance: Right Left Main  Mid LM to Dist LM lesion 50% stenosed  Mid LM to Dist LM lesion is 50% stenosed. The lesion is moderately calcified.  Left Anterior Descending  There is mild diffuse disease  throughout the vessel. Diffusely calcified LAD with mild nonobstructive disease throughout  Prox LAD to Mid LAD lesion 40% stenosed  Prox LAD to Mid LAD lesion is 40% stenosed.  Left Circumflex  Ost Cx to Prox Cx lesion 60% stenosed  Ost Cx to Prox Cx lesion is 60% stenosed. The lesion is severely calcified.  Right Coronary Artery  Ost RCA to Prox RCA lesion 100% stenosed  Ost RCA to Prox RCA lesion is 100% stenosed. The lesion is severely calcified. Distal vessel  collateralized from septal perforators of the LAD  Right Posterior Descending Artery  Collaterals  RPDA filled by collaterals from 1st Sept.    Third Right Posterolateral Branch  Collaterals  3rd RPL filled by collaterals from 2nd Sept.    Intervention  No interventions have been documented. Coronary Diagrams  Diagnostic Dominance: Right  Intervention  Implants   No implant documentation for this case.  Syngo Images  Show images for CARDIAC CATHETERIZATION  Images on Long Term Storage  Show images for [place], TEYON ODETTE to Procedure Log  Procedure Log    Hemo Data   Most Recent Value  Fick Cardiac Output 9.79 L/min  Fick Cardiac Output Index 5.39 (L/min)/BSA  RA A Wave 2 mmHg  RA V Wave 2 mmHg  RA Mean 1 mmHg  RV Systolic Pressure 20 mmHg  RV Diastolic Pressure -2 mmHg  RV EDP 1 mmHg  PA Systolic Pressure 21 mmHg  PA Diastolic Pressure 6 mmHg  PA Mean 13 mmHg  AO Systolic Pressure 403 mmHg  AO Diastolic Pressure 54 mmHg  AO Mean 74 mmHg  LV Systolic Pressure 474 mmHg  LV Diastolic Pressure -3 mmHg  LV EDP 3 mmHg  AOp Systolic Pressure 259 mmHg  AOp Diastolic Pressure 57 mmHg  AOp Mean Pressure 84 mmHg  LVp Systolic Pressure 563 mmHg  LVp Diastolic Pressure 0 mmHg  LVp EDP Pressure 2 mmHg  QP/QS 1  TPVR Index 2.41 HRUI  TSVR Index 13.73 HRUI  TPVR/TSVR Ratio 0.18     Cardiac TAVR CT  TECHNIQUE: The patient was scanned on a [employer]. A 120 kV retrospective  scan was triggered in the descending thoracic aorta at 111 HU's. Gantry rotation speed was 250 msecs and collimation was .6 mm. No beta blockade or nitro were given. The 3D data set was reconstructed in 5% intervals of the R-R cycle. Systolic and diastolic phases were analyzed on a dedicated work station using MPR, MIP and VRT modes. The patient received 80 cc of contrast.  FINDINGS: Aortic Valve: Trileaflet with severely calcified leaflets with severely restricted leaflets opening and no calcifications extending into the LVOT.  Aorta: Normal size with mild diffuse atherosclerotic plaque and calcifications and no dissection.  Sinotubular Junction: 28 x 28 mm  Ascending Thoracic Aorta: 38 x 37 mm  Aortic Arch: 30 x 29 mm  Descending Thoracic Aorta: 30 x 28 mm  Sinus of Valsalva Measurements:  Non-coronary: 32 mm  Right -coronary: 33 mm  Left -coronary: 36 mm  Coronary Artery Height above Annulus:  Left Main: 17 mm  Right Coronary: 17 mm  Virtual Basal Annulus Measurements:  Maximum/Minimum Diameter: 28.2 x 22.5 mm  Mean Diameter: 24.8 mm  Perimeter: 79.6 mm  Area: 483 mm2  Optimum Fluoroscopic Angle for Delivery: LAO 7 CAU 5.  IMPRESSION: 1. Trileaflet aortic valve with severely calcified leaflets with severely restricted leaflets opening and no calcifications extending into the LVOT. Aortic valve calcium score 3500 consistent with severe aortic stenosis. Annular measurements suitable for delivery of a 26 mm Edwards-SAPIEN 3 valve.  2. Sufficient coronary to annulus distance.  3. Optimum Fluoroscopic Angle for Delivery: LAO 7 CAU 5.  4. No thrombus in the left atrial appendage.   Electronically Signed   By: Ena Dawley   On: 10/26/2018 15:26   CT ANGIOGRAPHY CHEST, ABDOMEN AND PELVIS  TECHNIQUE: Multidetector CT imaging through the chest, abdomen and pelvis was performed using the standard protocol during bolus  administration of  intravenous contrast. Multiplanar reconstructed images and MIPs were obtained and reviewed to evaluate the vascular anatomy.  CONTRAST:  137mL OMNIPAQUE IOHEXOL 350 MG/ML SOLN  COMPARISON:  None.  FINDINGS: CTA CHEST FINDINGS  Cardiovascular: Heart size is normal. There is no significant pericardial fluid, thickening or pericardial calcification. There is aortic atherosclerosis, as well as atherosclerosis of the great vessels of the mediastinum and the coronary arteries, including calcified atherosclerotic plaque in the left main, left anterior descending, left circumflex and right coronary arteries. Severe thickening calcification of the aortic valve. Severe calcifications of the mitral annulus.  Mediastinum/Lymph Nodes: No pathologically enlarged mediastinal or hilar lymph nodes. Small hiatal hernia. No axillary lymphadenopathy.  Lungs/Pleura: Smoothly marginated 5 x 7 mm (mean diameter 6 mm) right lower lobe pulmonary nodule (axial image 55 of series 16). Mild scarring in the inferior segment of the left upper lobe. No acute consolidative airspace disease. No pleural effusions.  Musculoskeletal/Soft Tissues: There are no aggressive appearing lytic or blastic lesions noted in the visualized portions of the skeleton.  CTA ABDOMEN AND PELVIS FINDINGS  Hepatobiliary: No suspicious cystic or solid hepatic lesions. No intra or extrahepatic biliary ductal dilatation. Gallbladder is normal in appearance.  Pancreas: No pancreatic mass. No pancreatic ductal dilatation. No pancreatic or peripancreatic fluid or inflammatory changes.  Spleen: Unremarkable.  Adrenals/Urinary Tract: Low-attenuation lesions in both kidneys, compatible with simple cysts, largest of which is exophytic posteriorly in the interpolar region of the left kidney measuring 7.2 cm. Bilateral adrenal glands are normal in appearance. No hydroureteronephrosis. There are diverticulae  associated with the urinary bladder bilaterally, largest of which is on the right side measuring 3.5 x 2.2 cm. Several areas of urothelial hyperenhancement, best appreciated on axial image 177 of series 15 in the posterior aspect of the urinary bladder, and in the posterior aspect of the left-sided urinary bladder diverticulum.  Stomach/Bowel: Normal appearance of the stomach. No pathologic dilatation of small bowel or colon. Multiple colonic diverticulae are noted, without surrounding inflammatory changes to suggest an acute diverticulitis at this time. Status post right hemicolectomy.  Vascular/Lymphatic: Aortic atherosclerosis, without evidence of aneurysm or dissection in the abdominal or pelvic vasculature. Vascular findings and measurements pertinent to potential TAVR procedure, as detailed below. No lymphadenopathy noted in the abdomen or pelvis.  Reproductive: Prostate gland and seminal vesicles are unremarkable in appearance.  Other: 2 small ventral hernias are noted containing only omental fat. No significant volume of ascites. No pneumoperitoneum.  Musculoskeletal: There are no aggressive appearing lytic or blastic lesions noted in the visualized portions of the skeleton.  VASCULAR MEASUREMENTS PERTINENT TO TAVR:  AORTA:  Minimal Aortic Diameter-13 x 13 mm  Severity of Aortic Calcification-severe  RIGHT PELVIS:  Right Common Iliac Artery -  Minimal Diameter-7.4 x 5.9 mm  Tortuosity-mild  Calcification-severe  Right External Iliac Artery -  Minimal Diameter-8.9 x 4.9 mm  Tortuosity - mild  Calcification-moderate  Right Common Femoral Artery -  Minimal Diameter- no measurable patent lumen clearly identified (vessel is likely patent but nearly occluded)  Tortuosity - mild  Calcification-severe  LEFT PELVIS:  Left Common Iliac Artery -  Minimal Diameter-9.1 x 6.1 mm  Tortuosity -  mild  Calcification-severe  Left External Iliac Artery -  Minimal Diameter-7.5 x 7.8 mm  Tortuosity - mild  Calcification-moderate  Left Common Femoral Artery -  Minimal Diameter-2.1 x 6.7 mm  Tortuosity - mild  Calcification-severe  Review of the MIP images confirms the above findings.  IMPRESSION: 1. Vascular findings and measurements  pertinent to potential TAVR procedure, as detailed above. 2. Severe thickening calcification of the aortic valve, compatible with the reported clinical history of severe aortic stenosis. 3. Two small areas of urothelial hyperenhancement associated with the urinary bladder and a left-sided bladder diverticulum. This is nonspecific, but further clinical evaluation is recommended to exclude the possibility of urothelial neoplasm. 4. Smoothly marginated right lower lobe pulmonary nodule with a mean diameter of 6 mm. Non-contrast chest CT at 6-12 months is recommended. If the nodule is stable at time of repeat CT, then future CT at 18-24 months (from today's scan) is considered optional for low-risk patients, but is recommended for high-risk patients. This recommendation follows the consensus statement: Guidelines for Management of Incidental Pulmonary Nodules Detected on CT Images: From the Fleischner Society 2017; Radiology 2017; 284:228-243. 5. Aortic atherosclerosis, in addition to left main and 3 vessel coronary artery disease. 6. Two small ventral hernias containing only omental fat. No associated bowel incarceration or obstruction at this time 7. Additional incidental findings, as above.   Electronically Signed   By: Vinnie Langton M.D.   On: 10/26/2018 11:38   EKG: NSR with baseline first degree AV block   STS Risk Calculator  Procedure: AVR + CAB.   Risk of Mortality: 4.081%  Renal Failure: 2.195%  Permanent Stroke: 2.496%  Prolonged Ventilation: 9.017%  DSW Infection: 0.170%  Reoperation: 6.016%   Morbidity or Mortality: 16.721%  Short Length of Stay: 15.484%  Long Length of Stay: 11.127%      Impression:  Patient has stage D severe symptomatic aortic stenosis and multivessel coronary artery disease without ongoing symptoms of angina pectoris.  He presents with a 6 to 35-month history of increasing fatigue, exertional shortness of breath, and loss of appetite consistent with chronic diastolic congestive heart failure, [place] Heart Association functional class II.  He has also began to experience some mild memory loss and he just recently had his 89st birthday.  I have personally reviewed the patient's recent transthoracic echocardiogram, diagnostic cardiac catheterization, and CT angiograms.  Echocardiograms revealed normal left ventricular systolic function with significant left ventricular hypertrophy.  The aortic valve is trileaflet with severe thickening, calcification, and restricted leaflet mobility involving all 3 leaflets of the aortic valve.  Peak velocity across aortic valve has range between 3.5 and 3.9 m/s corresponding to mean transvalvular gradient estimated as high as 38 mmHg.  The DVI was reported 0.32.  Diagnostic cardiac catheterization confirmed the presence of significant aortic stenosis but also revealed multivessel coronary artery disease with 100% chronic occlusion of the right coronary artery and 50 to 60% tubular stenosis of the distal left main coronary artery.  Although this left main stenosis did not appear to be high-grade, DFR analysis revealed it to be functionally significant.  The patient does not report symptoms consistent with angina pectoris at this time.  I feel the patient would likely benefit from aortic valve replacement.  Given the presence of functionally significant left main coronary disease he would benefit from surgical coronary revascularization as well.  However, I would be very reluctant to consider this elderly gentleman a candidate for aortic  valve replacement and coronary artery bypass grafting because of his severely advanced age.  Alternatively,it might be reasonable to consider transcatheter aortic valve replacement with continued medical therapy for the patient's coronary artery disease.  Cardiac-gated CTA of the heart reveals anatomical characteristics consistent with aortic stenosis suitable for treatment by transcatheter aortic valve replacement without any significant complicating features.  CTA  of the aorta and iliac vessels demonstrate what appears to be adequate pelvic vascular access to facilitate a transfemoral approach with exception of severely calcified common femoral arteries bilaterally.  I do not feel that percutaneous transfemoral approach would be feasible and left transaxillary approach might prove to be a better option given the length and tortuosity of the patient's aorta.  Finally, given the patient's age medical therapy without any type of surgical intervention remains an option.   Plan:  The patient and his son were counseled at length regarding treatment alternatives for management of severe symptomatic aortic stenosis and multivessel CAD. Alternative approaches such as conventional aortic valve replacement with coronary artery bypass grafting, transcatheter aortic valve replacement with continued medical therapy for the patient's coronary artery disease were compared and contrasted at length.  Long-term prognosis for continued medical therapy without any type of intervention was discussed.  The risks associated with conventional surgery were discussed in detail, as were expectations for post-operative convalescence, and why I would be reluctant to consider this patient a candidate for conventional surgery.  Issues specific to transcatheter aortic valve replacement were discussed including questions about long term valve durability, the potential for paravalvular leak, possible increased risk of need for permanent  pacemaker placement, and other technical complications related to the procedure itself.  This discussion was placed in the context of the patient's own specific clinical presentation and past medical history.  All of their questions have been addressed.  The patient is interested in proceeding with transcatheter aortic valve replacement in the near future.  As a next step we will reach out to Roque Cash, PA-C and his colleagues at [facility] to inquire regarding the patient's recent diagnosis with possible urinary tract infection for which he is currently prescribed oral ciprofloxacin.  Once we can ascertain that the patient's infection has been eradicated we can make plans for transcatheter aortic valve replacement in the near future.  All questions answered.    I spent in excess of 90 minutes during the conduct of this office consultation and >50% of this time involved direct face-to-face encounter with the patient for counseling and/or coordination of their care.      Valentina Gu. Roxy Manns, MD 11/09/2018 1:39 PM

## 2018-11-09 NOTE — Patient Instructions (Signed)
Continue all previous medications without any changes at this time  

## 2018-11-10 ENCOUNTER — Encounter: Payer: Self-pay | Admitting: Thoracic Surgery (Cardiothoracic Vascular Surgery)

## 2018-11-11 ENCOUNTER — Telehealth: Payer: Self-pay | Admitting: Thoracic Surgery (Cardiothoracic Vascular Surgery)

## 2018-11-11 ENCOUNTER — Encounter: Payer: Self-pay | Admitting: Thoracic Surgery (Cardiothoracic Vascular Surgery)

## 2018-11-11 ENCOUNTER — Other Ambulatory Visit: Payer: Self-pay

## 2018-11-11 DIAGNOSIS — I350 Nonrheumatic aortic (valve) stenosis: Secondary | ICD-10-CM

## 2018-11-11 NOTE — Telephone Encounter (Signed)
Spoke with Isaias Cowman, PA-C over the telephone yesterday evening regarding Mr. Rueb who states that short course of oral ciprofloxacin was prescribed because of possible mild case of epididymitis.  However, urinalysis was not suggestive of ongoing infection.  He felt that as long as new worrisome symptoms do not develop there are no contraindications to proceeding with TAVR in the near future.  Rexene Alberts, MD 11/11/2018 9:34 AM

## 2018-11-13 ENCOUNTER — Encounter (HOSPITAL_COMMUNITY)
Admission: RE | Admit: 2018-11-13 | Discharge: 2018-11-13 | Disposition: A | Discharge status: Home / Self Care | Payer: Medicare Other | Source: Ambulatory Visit | Attending: Cardiovascular Disease | Admitting: Cardiovascular Disease

## 2018-11-13 ENCOUNTER — Encounter (HOSPITAL_COMMUNITY): Payer: Self-pay

## 2018-11-13 ENCOUNTER — Other Ambulatory Visit (HOSPITAL_COMMUNITY)
Admission: RE | Admit: 2018-11-13 | Discharge: 2018-11-13 | Disposition: A | Discharge status: Home / Self Care | Payer: Medicare Other | Source: Ambulatory Visit | Attending: Cardiovascular Disease | Admitting: Cardiovascular Disease

## 2018-11-13 ENCOUNTER — Other Ambulatory Visit: Payer: Self-pay

## 2018-11-13 DIAGNOSIS — Z85038 Personal history of other malignant neoplasm of large intestine: Secondary | ICD-10-CM | POA: Insufficient documentation

## 2018-11-13 DIAGNOSIS — I350 Nonrheumatic aortic (valve) stenosis: Secondary | ICD-10-CM

## 2018-11-13 DIAGNOSIS — I10 Essential (primary) hypertension: Secondary | ICD-10-CM | POA: Insufficient documentation

## 2018-11-13 DIAGNOSIS — I739 Peripheral vascular disease, unspecified: Secondary | ICD-10-CM | POA: Insufficient documentation

## 2018-11-13 DIAGNOSIS — Z01818 Encounter for other preprocedural examination: Secondary | ICD-10-CM | POA: Insufficient documentation

## 2018-11-13 HISTORY — DX: Anxiety disorder, unspecified: F41.9

## 2018-11-13 LAB — TYPE AND SCREEN
ABO/RH(D): AB POS
Antibody Screen: NEGATIVE

## 2018-11-13 LAB — BLOOD GAS, ARTERIAL
Acid-Base Excess: 1.7 mmol/L (ref 0.0–2.0)
Bicarbonate: 25.6 mmol/L (ref 20.0–28.0)
Drawn by: 421801
FIO2: 21
O2 Saturation: 98.2 %
Patient temperature: 98.6
pCO2 arterial: 39.1 mmHg (ref 32.0–48.0)
pH, Arterial: 7.431 (ref 7.350–7.450)
pO2, Arterial: 101 mmHg (ref 83.0–108.0)

## 2018-11-13 LAB — COMPREHENSIVE METABOLIC PANEL
ALT: 21 U/L (ref 0–44)
AST: 15 U/L (ref 15–41)
Albumin: 3.9 g/dL (ref 3.5–5.0)
Alkaline Phosphatase: 45 U/L (ref 38–126)
Anion gap: 13 (ref 5–15)
BUN: 11 mg/dL (ref 8–23)
CO2: 21 mmol/L — ABNORMAL LOW (ref 22–32)
Calcium: 9.2 mg/dL (ref 8.9–10.3)
Chloride: 93 mmol/L — ABNORMAL LOW (ref 98–111)
Creatinine, Ser: 0.83 mg/dL (ref 0.61–1.24)
GFR calc Af Amer: >60 mL/min (ref >60)
GFR calc non Af Amer: >60 mL/min (ref >60)
Glucose, Bld: 101 mg/dL — ABNORMAL HIGH (ref 70–99)
Potassium: 4.3 mmol/L (ref 3.5–5.1)
Sodium: 127 mmol/L — ABNORMAL LOW (ref 135–145)
Total Bilirubin: 0.7 mg/dL (ref 0.3–1.2)
Total Protein: 6.2 g/dL — ABNORMAL LOW (ref 6.5–8.1)

## 2018-11-13 LAB — URINALYSIS, ROUTINE W REFLEX MICROSCOPIC
Bilirubin Urine: NEGATIVE
Glucose, UA: NEGATIVE mg/dL
Hgb urine dipstick: NEGATIVE
Ketones, ur: NEGATIVE mg/dL
Leukocytes,Ua: NEGATIVE
Nitrite: NEGATIVE
Protein, ur: NEGATIVE mg/dL
Specific Gravity, Urine: 1.009 (ref 1.005–1.030)
pH: 7 (ref 5.0–8.0)

## 2018-11-13 LAB — SURGICAL PCR SCREEN
MRSA, PCR: NEGATIVE
Staphylococcus aureus: NEGATIVE

## 2018-11-13 LAB — CBC
HCT: 35.1 % — ABNORMAL LOW (ref 39.0–52.0)
Hemoglobin: 11.9 g/dL — ABNORMAL LOW (ref 13.0–17.0)
MCH: 33.6 pg (ref 26.0–34.0)
MCHC: 33.9 g/dL (ref 30.0–36.0)
MCV: 99.2 fL (ref 80.0–100.0)
Platelets: 293 10*3/uL (ref 150–400)
RBC: 3.54 MIL/uL — ABNORMAL LOW (ref 4.22–5.81)
RDW: 12.2 % (ref 11.5–15.5)
WBC: 5.5 10*3/uL (ref 4.0–10.5)
nRBC: 0 % (ref 0.0–0.2)

## 2018-11-13 LAB — HEMOGLOBIN A1C
Hgb A1c MFr Bld: 5.3 % (ref 4.8–5.6)
Mean Plasma Glucose: 105.41 mg/dL

## 2018-11-13 LAB — PROTIME-INR
INR: 1 (ref 0.8–1.2)
Prothrombin Time: 12.7 seconds (ref 11.4–15.2)

## 2018-11-13 LAB — ABO/RH: ABO/RH(D): AB POS

## 2018-11-13 LAB — BRAIN NATRIURETIC PEPTIDE: B Natriuretic Peptide: 168.4 pg/mL — ABNORMAL HIGH (ref 0.0–100.0)

## 2018-11-13 LAB — APTT: aPTT: 26 seconds (ref 24–36)

## 2018-11-13 NOTE — Progress Notes (Signed)
[place] [address] Phone: [phone] Fax: [phone]      Your procedure is scheduled on July 28th.  Report to [facility] Main Entrance "A" at 8:30 A.M., and check in at the Admitting office.  Call this number if you have problems the morning of surgery:  [phone]  Call [phone] if you have any questions prior to your surgery date Monday-Friday 8am-4pm    Remember:  Do not eat or drink after midnight the night before your surgery     Take these medicines the morning of surgery with A SIP OF WATER  - NONE   7 days prior to surgery STOP taking any Aspirin (unless otherwise instructed by your surgeon), Aleve, Naproxen, Ibuprofen, Motrin, Advil, Goody's, BC's, all herbal medications, fish oil, and all vitamins.    The Morning of Surgery  Do not wear jewelry  Do not wear lotions, powders, colognes, or deodorant  Men may shave face and neck.  Do not bring valuables to the hospital.  [facility] is not responsible for any belongings or valuables.  If you are a smoker, DO NOT Smoke 24 hours prior to surgery IF you wear a CPAP at night please bring your mask, tubing, and machine the morning of surgery   Remember that you must have someone to transport you home after your surgery, and remain with you for 24 hours if you are discharged the same day.   Contacts, glasses, hearing aids, dentures or bridgework may not be worn into surgery.    Leave your suitcase in the car.  After surgery it may be brought to your room.  For patients admitted to the hospital, discharge time will be determined by your treatment team.  Patients discharged the day of surgery will not be allowed to drive home.    Special instructions:   [facility]- Preparing For Surgery  Before surgery, you can play an important role. Because skin is not sterile, your skin needs to be as free of germs as possible.  You can reduce the number of germs on your skin by washing with CHG (chlorahexidine gluconate) Soap before surgery.  CHG is an antiseptic cleaner which kills germs and bonds with the skin to continue killing germs even after washing.    Oral Hygiene is also important to reduce your risk of infection.  Remember - BRUSH YOUR TEETH THE MORNING OF SURGERY WITH YOUR REGULAR TOOTHPASTE  Please do not use if you have an allergy to CHG or antibacterial soaps. If your skin becomes reddened/irritated stop using the CHG.  Do not shave (including legs and underarms) for at least 48 hours prior to first CHG shower. It is OK to shave your face.  Please follow these instructions carefully.   1. Shower the NIGHT BEFORE SURGERY and the MORNING OF SURGERY with CHG Soap.   2. If you chose to wash your hair, wash your hair first as usual with your normal shampoo.  3. After you shampoo, rinse your hair and body thoroughly to remove the shampoo.  4. Use CHG as you would any other liquid soap. You can apply CHG directly to the skin and wash gently with a scrungie or a clean washcloth.   5. Apply the CHG Soap to your body ONLY FROM THE NECK DOWN.  Do not use on open wounds or open sores. Avoid contact with your eyes, ears, mouth and genitals (private  parts). Wash Face and genitals (private parts)  with your normal soap.   6. Wash thoroughly, paying special attention to the area where your surgery will be performed.  7. Thoroughly rinse your body with warm water from the neck down.  8. DO NOT shower/wash with your normal soap after using and rinsing off the CHG Soap.  9. Pat yourself dry with a CLEAN TOWEL.  10. Wear CLEAN PAJAMAS to bed the night before surgery, wear comfortable clothes the morning of surgery  11. Place CLEAN SHEETS on your bed the night of your first shower and DO NOT SLEEP WITH PETS.   Day of Surgery:  Do not apply any deodorants/lotions. Please shower the morning of surgery with the  CHG soap  Please wear clean clothes to the hospital/surgery center.   Remember to brush your teeth WITH YOUR REGULAR TOOTHPASTE.   Please read over the following fact sheets that you were given.

## 2018-11-13 NOTE — Progress Notes (Signed)
PCP - Isaias Cowman Cardiologist - Isaias Cowman / Dr. Burt Knack  Chest x-ray - 11-13-18 EKG - 10-20-18  ECHO - 10-16-18 Cardiac Cath - 10-16-18   Anesthesia review: yes, heart history  Patient denies fever, cough and chest pain at PAT appointment Pt states he is often SOB, not a new symptom.  Patient verbalized understanding of instructions that were given to them at the PAT appointment. Patient was also instructed that they will need to review over the PAT instructions again at home before surgery.

## 2018-11-14 LAB — SARS CORONAVIRUS 2 (TAT 6-24 HRS): SARS Coronavirus 2: NEGATIVE

## 2018-11-16 MED ORDER — MAGNESIUM SULFATE 50 % IJ SOLN
40.0000 meq | INTRAMUSCULAR | Status: DC
  Filled 2018-11-16: qty 9.85

## 2018-11-16 MED ORDER — VANCOMYCIN HCL 10 G IV SOLR
1250.0000 mg | INTRAVENOUS | Status: AC
  Administered 2018-11-17: 11:00:00 1250 mg via INTRAVENOUS
  Filled 2018-11-16: qty 1250

## 2018-11-16 MED ORDER — SODIUM CHLORIDE 0.9 % IV SOLN
INTRAVENOUS | Status: DC
  Filled 2018-11-16: qty 30

## 2018-11-16 MED ORDER — POTASSIUM CHLORIDE 2 MEQ/ML IV SOLN
80.0000 meq | INTRAVENOUS | Status: DC
  Filled 2018-11-16: qty 40

## 2018-11-16 MED ORDER — SODIUM CHLORIDE 0.9 % IV SOLN
1.5000 g | INTRAVENOUS | Status: AC
  Administered 2018-11-17: 1.5 g via INTRAVENOUS
  Filled 2018-11-16: qty 1.5

## 2018-11-16 MED ORDER — DEXMEDETOMIDINE HCL IN NACL 400 MCG/100ML IV SOLN
0.1000 ug/kg/h | INTRAVENOUS | Status: AC
  Administered 2018-11-17: .5 ug/kg/h via INTRAVENOUS
  Filled 2018-11-16: qty 100

## 2018-11-16 MED ORDER — NOREPINEPHRINE 4 MG/250ML-% IV SOLN
0.0000 ug/min | INTRAVENOUS | Status: AC
  Administered 2018-11-17: 1 ug/min via INTRAVENOUS
  Filled 2018-11-16: qty 250

## 2018-11-17 ENCOUNTER — Inpatient Hospital Stay (HOSPITAL_COMMUNITY): Payer: Medicare Other

## 2018-11-17 ENCOUNTER — Encounter (HOSPITAL_COMMUNITY)
Admission: RE | Disposition: A | Discharge status: Home / Self Care | Payer: Medicare Other | Source: Home / Self Care | Attending: Thoracic Surgery (Cardiothoracic Vascular Surgery)

## 2018-11-17 ENCOUNTER — Ambulatory Visit (HOSPITAL_COMMUNITY): Payer: Medicare Other

## 2018-11-17 ENCOUNTER — Other Ambulatory Visit: Payer: Self-pay

## 2018-11-17 ENCOUNTER — Inpatient Hospital Stay (HOSPITAL_COMMUNITY): Payer: Medicare Other | Admitting: Physician Assistant

## 2018-11-17 ENCOUNTER — Inpatient Hospital Stay (HOSPITAL_COMMUNITY): Payer: Medicare Other | Admitting: Certified Registered Nurse Anesthetist

## 2018-11-17 ENCOUNTER — Other Ambulatory Visit: Payer: Self-pay | Admitting: Physician Assistant

## 2018-11-17 ENCOUNTER — Inpatient Hospital Stay (HOSPITAL_COMMUNITY)
Admission: RE | Admit: 2018-11-17 | Discharge: 2018-11-18 | DRG: 266 | Disposition: A | Discharge status: Home / Self Care | Payer: Medicare Other | Attending: Thoracic Surgery (Cardiothoracic Vascular Surgery) | Admitting: Thoracic Surgery (Cardiothoracic Vascular Surgery)

## 2018-11-17 ENCOUNTER — Encounter (HOSPITAL_COMMUNITY): Payer: Self-pay | Admitting: *Deleted

## 2018-11-17 DIAGNOSIS — I350 Nonrheumatic aortic (valve) stenosis: Principal | ICD-10-CM

## 2018-11-17 DIAGNOSIS — Z952 Presence of prosthetic heart valve: Secondary | ICD-10-CM

## 2018-11-17 DIAGNOSIS — I10 Essential (primary) hypertension: Secondary | ICD-10-CM | POA: Diagnosis present

## 2018-11-17 DIAGNOSIS — E785 Hyperlipidemia, unspecified: Secondary | ICD-10-CM | POA: Diagnosis present

## 2018-11-17 DIAGNOSIS — R911 Solitary pulmonary nodule: Secondary | ICD-10-CM | POA: Insufficient documentation

## 2018-11-17 DIAGNOSIS — Z006 Encounter for examination for normal comparison and control in clinical research program: Secondary | ICD-10-CM

## 2018-11-17 DIAGNOSIS — M8580 Other specified disorders of bone density and structure, unspecified site: Secondary | ICD-10-CM | POA: Diagnosis present

## 2018-11-17 DIAGNOSIS — I739 Peripheral vascular disease, unspecified: Secondary | ICD-10-CM | POA: Diagnosis present

## 2018-11-17 DIAGNOSIS — I5033 Acute on chronic diastolic (congestive) heart failure: Secondary | ICD-10-CM | POA: Diagnosis present

## 2018-11-17 DIAGNOSIS — I110 Hypertensive heart disease with heart failure: Secondary | ICD-10-CM | POA: Diagnosis present

## 2018-11-17 DIAGNOSIS — E871 Hypo-osmolality and hyponatremia: Secondary | ICD-10-CM | POA: Diagnosis present

## 2018-11-17 DIAGNOSIS — H9190 Unspecified hearing loss, unspecified ear: Secondary | ICD-10-CM | POA: Diagnosis present

## 2018-11-17 DIAGNOSIS — Z79899 Other long term (current) drug therapy: Secondary | ICD-10-CM | POA: Diagnosis not present

## 2018-11-17 DIAGNOSIS — Z9049 Acquired absence of other specified parts of digestive tract: Secondary | ICD-10-CM

## 2018-11-17 DIAGNOSIS — I2510 Atherosclerotic heart disease of native coronary artery without angina pectoris: Secondary | ICD-10-CM | POA: Diagnosis present

## 2018-11-17 DIAGNOSIS — D509 Iron deficiency anemia, unspecified: Secondary | ICD-10-CM | POA: Diagnosis present

## 2018-11-17 DIAGNOSIS — D649 Anemia, unspecified: Secondary | ICD-10-CM | POA: Diagnosis present

## 2018-11-17 DIAGNOSIS — Z85038 Personal history of other malignant neoplasm of large intestine: Secondary | ICD-10-CM | POA: Diagnosis not present

## 2018-11-17 HISTORY — DX: Unspecified hearing loss, unspecified ear: H91.90

## 2018-11-17 HISTORY — DX: Personal history of other malignant neoplasm of large intestine: Z85.038

## 2018-11-17 HISTORY — PX: TEE WITHOUT CARDIOVERSION: SHX5443

## 2018-11-17 HISTORY — DX: Dependence on other enabling machines and devices: Z99.89

## 2018-11-17 HISTORY — DX: Presence of prosthetic heart valve: Z95.2

## 2018-11-17 HISTORY — DX: Solitary pulmonary nodule: R91.1

## 2018-11-17 LAB — CBC
HCT: 30.1 % — ABNORMAL LOW (ref 39.0–52.0)
Hemoglobin: 10.6 g/dL — ABNORMAL LOW (ref 13.0–17.0)
MCH: 33.7 pg (ref 26.0–34.0)
MCHC: 35.2 g/dL (ref 30.0–36.0)
MCV: 95.6 fL (ref 80.0–100.0)
Platelets: 256 10*3/uL (ref 150–400)
RBC: 3.15 MIL/uL — ABNORMAL LOW (ref 4.22–5.81)
RDW: 12.3 % (ref 11.5–15.5)
WBC: 11.8 10*3/uL — ABNORMAL HIGH (ref 4.0–10.5)
nRBC: 0 % (ref 0.0–0.2)

## 2018-11-17 LAB — POCT I-STAT 4, (NA,K, GLUC, HGB,HCT)
Glucose, Bld: 113 mg/dL — ABNORMAL HIGH (ref 70–99)
Glucose, Bld: 132 mg/dL — ABNORMAL HIGH (ref 70–99)
HCT: 29 % — ABNORMAL LOW (ref 39.0–52.0)
HCT: 25 % — ABNORMAL LOW (ref 39.0–52.0)
Hemoglobin: 9.9 g/dL — ABNORMAL LOW (ref 13.0–17.0)
Hemoglobin: 8.5 g/dL — ABNORMAL LOW (ref 13.0–17.0)
Potassium: 3.7 mmol/L (ref 3.5–5.1)
Potassium: 3.9 mmol/L (ref 3.5–5.1)
Sodium: 127 mmol/L — ABNORMAL LOW (ref 135–145)
Sodium: 127 mmol/L — ABNORMAL LOW (ref 135–145)

## 2018-11-17 LAB — POCT I-STAT, CHEM 8
BUN: 13 mg/dL (ref 8–23)
Calcium, Ion: 1.18 mmol/L (ref 1.15–1.40)
Chloride: 93 mmol/L — ABNORMAL LOW (ref 98–111)
Creatinine, Ser: 0.8 mg/dL (ref 0.61–1.24)
Glucose, Bld: 141 mg/dL — ABNORMAL HIGH (ref 70–99)
HCT: 29 % — ABNORMAL LOW (ref 39.0–52.0)
Hemoglobin: 9.9 g/dL — ABNORMAL LOW (ref 13.0–17.0)
Potassium: 4.1 mmol/L (ref 3.5–5.1)
Sodium: 127 mmol/L — ABNORMAL LOW (ref 135–145)
TCO2: 26 mmol/L (ref 22–32)

## 2018-11-17 SURGERY — IMPLANTATION, AORTIC VALVE, TRANSCATHETER, SUBCLAVIAN ARTERY APPROACH
Anesthesia: Monitor Anesthesia Care | Site: Chest

## 2018-11-17 MED ORDER — MORPHINE SULFATE (PF) 2 MG/ML IV SOLN
INTRAVENOUS | Status: AC
  Filled 2018-11-17: qty 1

## 2018-11-17 MED ORDER — IODIXANOL 320 MG/ML IV SOLN
INTRAVENOUS | Status: DC | PRN
  Administered 2018-11-17: 12:00:00 150 mL via INTRAVENOUS

## 2018-11-17 MED ORDER — DEXAMETHASONE SODIUM PHOSPHATE 10 MG/ML IJ SOLN
INTRAMUSCULAR | Status: DC | PRN
  Administered 2018-11-17: 5 mg via INTRAVENOUS

## 2018-11-17 MED ORDER — HEPARIN SODIUM (PORCINE) 1000 UNIT/ML IJ SOLN
INTRAMUSCULAR | Status: DC | PRN
  Administered 2018-11-17: 10000 [IU] via INTRAVENOUS

## 2018-11-17 MED ORDER — MORPHINE SULFATE (PF) 2 MG/ML IV SOLN
1.0000 mg | INTRAVENOUS | Status: DC | PRN
  Administered 2018-11-17 (×2): 2 mg via INTRAVENOUS

## 2018-11-17 MED ORDER — LABETALOL HCL 5 MG/ML IV SOLN
10.0000 mg | INTRAVENOUS | Status: DC | PRN
  Administered 2018-11-17: 14:00:00 10 mg via INTRAVENOUS

## 2018-11-17 MED ORDER — SODIUM CHLORIDE 0.9% FLUSH: 3.0000 mL | INTRAVENOUS | Status: DC | PRN

## 2018-11-17 MED ORDER — FENTANYL CITRATE (PF) 250 MCG/5ML IJ SOLN
INTRAMUSCULAR | Status: AC
  Filled 2018-11-17: qty 5

## 2018-11-17 MED ORDER — PHENYLEPHRINE HCL-NACL 20-0.9 MG/250ML-% IV SOLN
0.0000 ug/min | INTRAVENOUS | Status: DC
  Filled 2018-11-17: qty 250

## 2018-11-17 MED ORDER — SODIUM CHLORIDE 0.9 % IV SOLN
INTRAVENOUS | Status: DC | PRN
  Administered 2018-11-17: 12:00:00 25 ug/min via INTRAVENOUS

## 2018-11-17 MED ORDER — CHLORHEXIDINE GLUCONATE 4 % EX LIQD: 60.0000 mL | Freq: Once | CUTANEOUS | Status: DC

## 2018-11-17 MED ORDER — CHLORHEXIDINE GLUCONATE 0.12 % MT SOLN
15.0000 mL | Freq: Once | OROMUCOSAL | Status: AC
  Administered 2018-11-17: 09:00:00 15 mL via OROMUCOSAL

## 2018-11-17 MED ORDER — ROCURONIUM BROMIDE 10 MG/ML (PF) SYRINGE
PREFILLED_SYRINGE | INTRAVENOUS | Status: DC | PRN
  Administered 2018-11-17 (×2): 40 mg via INTRAVENOUS

## 2018-11-17 MED ORDER — LACTATED RINGERS IV SOLN
INTRAVENOUS | Status: DC
  Administered 2018-11-17 (×2): 1000 mL via INTRAVENOUS
  Administered 2018-11-17 (×2): via INTRAVENOUS

## 2018-11-17 MED ORDER — SODIUM CHLORIDE 0.9 % IV SOLN
INTRAVENOUS | Status: DC | PRN
  Administered 2018-11-17 (×3): 500 mL

## 2018-11-17 MED ORDER — ACETAMINOPHEN 500 MG PO TABS
1000.0000 mg | ORAL_TABLET | Freq: Once | ORAL | Status: AC
  Administered 2018-11-17: 09:00:00 1000 mg via ORAL
  Filled 2018-11-17: qty 2

## 2018-11-17 MED ORDER — TRAMADOL HCL 50 MG PO TABS: 50.0000 mg | ORAL_TABLET | ORAL | Status: DC | PRN

## 2018-11-17 MED ORDER — NITROGLYCERIN IN D5W 200-5 MCG/ML-% IV SOLN
0.0000 ug/min | INTRAVENOUS | Status: DC
  Administered 2018-11-17: 20 ug/min via INTRAVENOUS

## 2018-11-17 MED ORDER — CHLORHEXIDINE GLUCONATE 4 % EX LIQD: 30.0000 mL | CUTANEOUS | Status: DC

## 2018-11-17 MED ORDER — PROPOFOL 10 MG/ML IV BOLUS
INTRAVENOUS | Status: DC | PRN
  Administered 2018-11-17: 150 mg via INTRAVENOUS

## 2018-11-17 MED ORDER — ONDANSETRON HCL 4 MG/2ML IJ SOLN: 4.0000 mg | Freq: Once | INTRAMUSCULAR | Status: DC | PRN

## 2018-11-17 MED ORDER — VANCOMYCIN HCL IN DEXTROSE 1-5 GM/200ML-% IV SOLN
1000.0000 mg | Freq: Once | INTRAVENOUS | Status: AC
  Administered 2018-11-17: 1000 mg via INTRAVENOUS
  Filled 2018-11-17: qty 200

## 2018-11-17 MED ORDER — FINASTERIDE 5 MG PO TABS
5.0000 mg | ORAL_TABLET | Freq: Every day | ORAL | Status: DC
  Administered 2018-11-17 – 2018-11-18 (×2): 5 mg via ORAL
  Filled 2018-11-17 (×2): qty 1

## 2018-11-17 MED ORDER — ONDANSETRON HCL 4 MG/2ML IJ SOLN
4.0000 mg | Freq: Four times a day (QID) | INTRAMUSCULAR | Status: DC | PRN
  Administered 2018-11-17: 4 mg via INTRAVENOUS

## 2018-11-17 MED ORDER — ONDANSETRON HCL 4 MG/2ML IJ SOLN
INTRAMUSCULAR | Status: DC | PRN
  Administered 2018-11-17: 4 mg via INTRAVENOUS

## 2018-11-17 MED ORDER — NITROGLYCERIN IN D5W 200-5 MCG/ML-% IV SOLN
INTRAVENOUS | Status: AC
  Filled 2018-11-17: qty 250

## 2018-11-17 MED ORDER — SODIUM CHLORIDE 0.9 % IV SOLN
INTRAVENOUS | Status: AC
  Filled 2018-11-17 (×3): qty 1.2

## 2018-11-17 MED ORDER — DEXAMETHASONE SODIUM PHOSPHATE 10 MG/ML IJ SOLN
INTRAMUSCULAR | Status: AC
  Filled 2018-11-17: qty 1

## 2018-11-17 MED ORDER — ASPIRIN EC 81 MG PO TBEC
81.0000 mg | DELAYED_RELEASE_TABLET | Freq: Every day | ORAL | Status: DC
  Administered 2018-11-18: 81 mg via ORAL
  Filled 2018-11-17: qty 1

## 2018-11-17 MED ORDER — PROTAMINE SULFATE 10 MG/ML IV SOLN
INTRAVENOUS | Status: DC | PRN
  Administered 2018-11-17: 100 mg via INTRAVENOUS

## 2018-11-17 MED ORDER — SIMVASTATIN 20 MG PO TABS
20.0000 mg | ORAL_TABLET | Freq: Every day | ORAL | Status: DC
  Administered 2018-11-17: 20 mg via ORAL
  Filled 2018-11-17: qty 1

## 2018-11-17 MED ORDER — ONDANSETRON HCL 4 MG/2ML IJ SOLN
INTRAMUSCULAR | Status: AC
  Filled 2018-11-17: qty 2

## 2018-11-17 MED ORDER — SODIUM CHLORIDE 0.9% FLUSH
3.0000 mL | Freq: Two times a day (BID) | INTRAVENOUS | Status: DC
  Administered 2018-11-17: 3 mL via INTRAVENOUS

## 2018-11-17 MED ORDER — LIDOCAINE 2% (20 MG/ML) 5 ML SYRINGE
INTRAMUSCULAR | Status: DC | PRN
  Administered 2018-11-17: 60 mg via INTRAVENOUS

## 2018-11-17 MED ORDER — SODIUM CHLORIDE 0.9 % IV SOLN
1.5000 g | Freq: Two times a day (BID) | INTRAVENOUS | Status: DC
  Administered 2018-11-17 – 2018-11-18 (×2): 1.5 g via INTRAVENOUS
  Filled 2018-11-17 (×4): qty 1.5

## 2018-11-17 MED ORDER — ACETAMINOPHEN 650 MG RE SUPP: 650.0000 mg | Freq: Four times a day (QID) | RECTAL | Status: DC | PRN

## 2018-11-17 MED ORDER — ALPRAZOLAM 0.25 MG PO TABS
0.2500 mg | ORAL_TABLET | Freq: Every day | ORAL | Status: DC
  Administered 2018-11-17: 0.25 mg via ORAL
  Filled 2018-11-17: qty 1

## 2018-11-17 MED ORDER — FENTANYL CITRATE (PF) 100 MCG/2ML IJ SOLN: 25.0000 ug | INTRAMUSCULAR | Status: DC | PRN

## 2018-11-17 MED ORDER — PHENYLEPHRINE 40 MCG/ML (10ML) SYRINGE FOR IV PUSH (FOR BLOOD PRESSURE SUPPORT)
PREFILLED_SYRINGE | INTRAVENOUS | Status: DC | PRN
  Administered 2018-11-17: 80 ug via INTRAVENOUS

## 2018-11-17 MED ORDER — PHENYLEPHRINE 40 MCG/ML (10ML) SYRINGE FOR IV PUSH (FOR BLOOD PRESSURE SUPPORT)
PREFILLED_SYRINGE | INTRAVENOUS | Status: AC
  Filled 2018-11-17: qty 10

## 2018-11-17 MED ORDER — SODIUM CHLORIDE 0.9 % IV SOLN
INTRAVENOUS | Status: AC
  Administered 2018-11-17: 14:00:00 via INTRAVENOUS

## 2018-11-17 MED ORDER — PANTOPRAZOLE SODIUM 40 MG PO TBEC
40.0000 mg | DELAYED_RELEASE_TABLET | Freq: Every day | ORAL | Status: DC
  Administered 2018-11-17 – 2018-11-18 (×2): 40 mg via ORAL
  Filled 2018-11-17 (×2): qty 1

## 2018-11-17 MED ORDER — OXYCODONE HCL 5 MG PO TABS: 5.0000 mg | ORAL_TABLET | ORAL | Status: DC | PRN

## 2018-11-17 MED ORDER — LIDOCAINE 2% (20 MG/ML) 5 ML SYRINGE
INTRAMUSCULAR | Status: AC
  Filled 2018-11-17: qty 5

## 2018-11-17 MED ORDER — SUGAMMADEX SODIUM 200 MG/2ML IV SOLN
INTRAVENOUS | Status: DC | PRN
  Administered 2018-11-17: 200 mg via INTRAVENOUS

## 2018-11-17 MED ORDER — ROCURONIUM BROMIDE 10 MG/ML (PF) SYRINGE
PREFILLED_SYRINGE | INTRAVENOUS | Status: AC
  Filled 2018-11-17: qty 10

## 2018-11-17 MED ORDER — CHLORHEXIDINE GLUCONATE 0.12 % MT SOLN
OROMUCOSAL | Status: AC
  Administered 2018-11-17: 15 mL via OROMUCOSAL
  Filled 2018-11-17: qty 15

## 2018-11-17 MED ORDER — SODIUM CHLORIDE 0.9 % IV SOLN: 250.0000 mL | INTRAVENOUS | Status: DC | PRN

## 2018-11-17 MED ORDER — AMLODIPINE BESYLATE 5 MG PO TABS
5.0000 mg | ORAL_TABLET | Freq: Two times a day (BID) | ORAL | Status: DC
  Administered 2018-11-17 – 2018-11-18 (×2): 5 mg via ORAL
  Filled 2018-11-17 (×2): qty 1

## 2018-11-17 MED ORDER — 0.9 % SODIUM CHLORIDE (POUR BTL) OPTIME
TOPICAL | Status: DC | PRN
  Administered 2018-11-17: 12:00:00 1000 mL

## 2018-11-17 MED ORDER — ACETAMINOPHEN 325 MG PO TABS
650.0000 mg | ORAL_TABLET | Freq: Four times a day (QID) | ORAL | Status: DC | PRN
  Administered 2018-11-18: 650 mg via ORAL
  Filled 2018-11-17: qty 2

## 2018-11-17 MED ORDER — CLOPIDOGREL BISULFATE 75 MG PO TABS
75.0000 mg | ORAL_TABLET | Freq: Every day | ORAL | Status: DC
  Administered 2018-11-18: 10:00:00 75 mg via ORAL
  Filled 2018-11-17: qty 1

## 2018-11-17 MED ORDER — FENTANYL CITRATE (PF) 250 MCG/5ML IJ SOLN
INTRAMUSCULAR | Status: DC | PRN
  Administered 2018-11-17: 25 ug via INTRAVENOUS
  Administered 2018-11-17: 50 ug via INTRAVENOUS
  Administered 2018-11-17: 25 ug via INTRAVENOUS
  Administered 2018-11-17: 50 ug via INTRAVENOUS

## 2018-11-17 MED ORDER — SODIUM CHLORIDE 0.9 % IV SOLN: INTRAVENOUS | Status: DC

## 2018-11-17 MED ORDER — LABETALOL HCL 5 MG/ML IV SOLN
INTRAVENOUS | Status: AC
  Filled 2018-11-17: qty 4

## 2018-11-17 SURGICAL SUPPLY — 96 items
BAG DECANTER FOR FLEXI CONT (MISCELLANEOUS) ×2 IMPLANT
BAG SNAP BAND KOVER 36X36 (MISCELLANEOUS) ×6 IMPLANT
BLADE CLIPPER SURG (BLADE) ×2 IMPLANT
BLADE OSCILLATING /SAGITTAL (BLADE) IMPLANT
BLADE STERNUM SYSTEM 6 (BLADE) IMPLANT
CABLE ADAPT CONN TEMP 6FT (ADAPTER) ×4 IMPLANT
CANNULA FEM VENOUS REMOTE 22FR (CANNULA) IMPLANT
CANNULA OPTISITE PERFUSION 16F (CANNULA) IMPLANT
CANNULA OPTISITE PERFUSION 18F (CANNULA) IMPLANT
CATH DIAG EXPO 6F AL1 (CATHETERS) IMPLANT
CATH DIAG EXPO 6F VENT PIG 145 (CATHETERS) ×8 IMPLANT
CATH EXTERNAL FEMALE PUREWICK (CATHETERS) IMPLANT
CATH INFINITI 6F AL2 (CATHETERS) ×2 IMPLANT
CATH S G BIP PACING (CATHETERS) ×4 IMPLANT
CLIP VESOCCLUDE MED 24/CT (CLIP) ×2 IMPLANT
CLIP VESOCCLUDE SM WIDE 24/CT (CLIP) IMPLANT
CONT SPEC 4OZ CLIKSEAL STRL BL (MISCELLANEOUS) ×8 IMPLANT
COVER BACK TABLE 24X17X13 BIG (DRAPES) IMPLANT
COVER BACK TABLE 80X110 HD (DRAPES) ×8 IMPLANT
COVER DOME SNAP 22 D (MISCELLANEOUS) IMPLANT
COVER WAND RF STERILE (DRAPES) ×2 IMPLANT
DERMABOND ADHESIVE PROPEN (GAUZE/BANDAGES/DRESSINGS) ×6
DERMABOND ADVANCED (GAUZE/BANDAGES/DRESSINGS) ×2
DERMABOND ADVANCED .7 DNX12 (GAUZE/BANDAGES/DRESSINGS) ×2 IMPLANT
DERMABOND ADVANCED .7 DNX6 (GAUZE/BANDAGES/DRESSINGS) IMPLANT
DEVICE CLOSURE PERCLS PRGLD 6F (VASCULAR PRODUCTS) ×4 IMPLANT
DRAPE INCISE IOBAN 66X45 STRL (DRAPES) IMPLANT
DRAPE SURG IRRIG POUCH 19X23 (DRAPES) ×2 IMPLANT
DRSG TEGADERM 2-3/8X2-3/4 SM (GAUZE/BANDAGES/DRESSINGS) ×4 IMPLANT
DRSG TEGADERM 4X4.75 (GAUZE/BANDAGES/DRESSINGS) ×8 IMPLANT
ELECT CAUTERY BLADE 6.4 (BLADE) IMPLANT
ELECT REM PT RETURN 9FT ADLT (ELECTROSURGICAL) ×8
ELECTRODE REM PT RTRN 9FT ADLT (ELECTROSURGICAL) ×4 IMPLANT
FELT TEFLON 6X6 (MISCELLANEOUS) IMPLANT
FEMORAL VENOUS CANN RAP (CANNULA) IMPLANT
GAUZE SPONGE 4X4 12PLY STRL (GAUZE/BANDAGES/DRESSINGS) ×6 IMPLANT
GLOVE BIO SURGEON STRL SZ7.5 (GLOVE) IMPLANT
GLOVE BIO SURGEON STRL SZ8 (GLOVE) IMPLANT
GLOVE EUDERMIC 7 POWDERFREE (GLOVE) IMPLANT
GLOVE ORTHO TXT STRL SZ7.5 (GLOVE) IMPLANT
GOWN STRL REUS W/ TWL LRG LVL3 (GOWN DISPOSABLE) IMPLANT
GOWN STRL REUS W/ TWL XL LVL3 (GOWN DISPOSABLE) ×2 IMPLANT
GOWN STRL REUS W/TWL LRG LVL3 (GOWN DISPOSABLE)
GOWN STRL REUS W/TWL XL LVL3 (GOWN DISPOSABLE) ×2
GUIDEWIRE SAFE TJ AMPLATZ EXST (WIRE) ×6 IMPLANT
INSERT FOGARTY SM (MISCELLANEOUS) IMPLANT
KIT BASIN OR (CUSTOM PROCEDURE TRAY) ×4 IMPLANT
KIT DILATOR VASC 18G NDL (KITS) IMPLANT
KIT HEART LEFT (KITS) ×4 IMPLANT
KIT SUCTION CATH 14FR (SUCTIONS) IMPLANT
KIT TURNOVER KIT B (KITS) ×4 IMPLANT
LOOP VESSEL MAXI BLUE (MISCELLANEOUS) ×2 IMPLANT
LOOP VESSEL MINI RED (MISCELLANEOUS) IMPLANT
NDL PERC 18GX7CM (NEEDLE) ×2 IMPLANT
NEEDLE 22X1 1/2 (OR ONLY) (NEEDLE) IMPLANT
NEEDLE PERC 18GX7CM (NEEDLE) ×4 IMPLANT
NS IRRIG 1000ML POUR BTL (IV SOLUTION) ×12 IMPLANT
PACK ENDOVASCULAR (PACKS) ×4 IMPLANT
PAD ARMBOARD 7.5X6 YLW CONV (MISCELLANEOUS) ×8 IMPLANT
PAD ELECT DEFIB RADIOL ZOLL (MISCELLANEOUS) ×4 IMPLANT
PENCIL BUTTON HOLSTER BLD 10FT (ELECTRODE) ×4 IMPLANT
PERCLOSE PROGLIDE 6F (VASCULAR PRODUCTS) ×8
POSITIONER HEAD DONUT 9IN (MISCELLANEOUS) ×4 IMPLANT
SET MICROPUNCTURE 5F STIFF (MISCELLANEOUS) ×4 IMPLANT
SHEATH BRITE TIP 6FR 35CM (SHEATH) ×4 IMPLANT
SHEATH PINNACLE 6F 10CM (SHEATH) ×4 IMPLANT
SHEATH PINNACLE 8F 10CM (SHEATH) ×4 IMPLANT
SLEEVE REPOSITIONING LENGTH 30 (MISCELLANEOUS) ×4 IMPLANT
SPONGE LAP 4X18 RFD (DISPOSABLE) IMPLANT
STOPCOCK MORSE 400PSI 3WAY (MISCELLANEOUS) ×8 IMPLANT
SUT ETHIBOND X763 2 0 SH 1 (SUTURE) IMPLANT
SUT GORETEX CV 4 TH 22 36 (SUTURE) ×2 IMPLANT
SUT GORETEX CV4 TH-18 (SUTURE) ×4 IMPLANT
SUT MNCRL AB 3-0 PS2 18 (SUTURE) IMPLANT
SUT MNCRL AB 4-0 PS2 18 (SUTURE) ×2 IMPLANT
SUT PROLENE 5 0 C 1 36 (SUTURE) ×2 IMPLANT
SUT PROLENE 6 0 C 1 30 (SUTURE) ×2 IMPLANT
SUT SILK  1 MH (SUTURE) ×2
SUT SILK 1 MH (SUTURE) ×2 IMPLANT
SUT SILK 4 0 (SUTURE) ×2
SUT SILK 4-0 18XBRD TIE 12 (SUTURE) IMPLANT
SUT VIC AB 2-0 CT1 27 (SUTURE)
SUT VIC AB 2-0 CT1 TAPERPNT 27 (SUTURE) IMPLANT
SUT VIC AB 2-0 CTX 36 (SUTURE) IMPLANT
SUT VIC AB 3-0 SH 8-18 (SUTURE) ×2 IMPLANT
SYR 50ML LL SCALE MARK (SYRINGE) ×4 IMPLANT
SYR BULB IRRIGATION 50ML (SYRINGE) IMPLANT
SYR CONTROL 10ML LL (SYRINGE) IMPLANT
TOWEL GREEN STERILE (TOWEL DISPOSABLE) ×4 IMPLANT
TOWEL GREEN STERILE FF (TOWEL DISPOSABLE) ×4 IMPLANT
TRANSDUCER W/STOPCOCK (MISCELLANEOUS) ×8 IMPLANT
TRAY FOLEY SLVR 16FR TEMP STAT (SET/KITS/TRAYS/PACK) IMPLANT
VALVE 26 ULTRA SAPIEN (Valve) ×2 IMPLANT
VALVE 26 ULTRA SAPIEN KIT (Valve) ×2 IMPLANT
WIRE EMERALD 3MM-J .035X150CM (WIRE) ×4 IMPLANT
WIRE EMERALD 3MM-J .035X260CM (WIRE) ×4 IMPLANT

## 2018-11-17 NOTE — H&P (Signed)
Cardiology Admission History and Physical:   Patient ID: CARNEL STEGMAN MRN: [id number]; DOB: February 16, 1926   Admission date: 11/17/2018  Primary Care Provider: Secundino Ginger, PA-C Primary Cardiologist: No primary care provider on file.  Primary Electrophysiologist:  None   Chief Complaint:  Shortness of breath  Patient Profile:   RENTON BERKLEY is a 85 y.o. male with severe, symptomatic, stage D1 Aortic Stenosis, presenting today for TAVR  History of Present Illness:   Mr. Jacinto is a 85 yo male with longstanding heart murmur who has been followed with serial echo studies, now with progressive and severe symptomatic aortic stenosis. He continues to have progressive fatigue and exertional dyspnea. Denies CP, orthopnea, or PND. Reports occasional dizziness but no syncope. As part of his preop workup, he was noted to have multivessel CAD (chronic RCA occlusion) and moderate left main disease by catheterization. He has not experienced any anginal chest pain.   Heart Pathway Score:     Past Medical History:  Diagnosis Date  . Anemia    iron defiency  . Anxiety   . Coronary artery disease involving left main coronary artery 10/16/2018  . Hearing loss    bilateral - no hearing aids  . History of colon cancer   . Hyperlipidemia    MIXED  . Hypertension   . Osteopenia   . PAD (peripheral artery disease) ([place])   . Pulmonary nodule   . Severe aortic stenosis   . Thoracic aortic ectasia ([place])   . Uses walker   . Vitamin D deficiency     Past Surgical History:  Procedure Laterality Date  . CARDIAC CATHETERIZATION    . COLONOSCOPY  08/2009   SCREENING..HYPLASTIC POLYPECTOMY..DR. DARRELUS  . INTRAVASCULAR PRESSURE WIRE/FFR STUDY N/A 10/16/2018   Procedure: INTRAVASCULAR PRESSURE WIRE/FFR STUDY;  Surgeon: Sherren Mocha, MD;  Location: [facility];  Service: Cardiovascular;  Laterality: N/A;  . RIGHT/LEFT HEART CATH AND CORONARY ANGIOGRAPHY N/A 10/16/2018   Procedure:  RIGHT/LEFT HEART CATH AND CORONARY ANGIOGRAPHY;  Surgeon: Sherren Mocha, MD;  Location: [facility];  Service: Cardiovascular;  Laterality: N/A;     Medications Prior to Admission: Prior to Admission medications   Medication Sig Start Date End Date Taking? Authorizing Provider  ALPRAZolam (XANAX) 0.25 MG tablet Take 0.25 mg by mouth at bedtime. 10/01/18  Yes [provider]  amLODipine (NORVASC) 5 MG tablet Take 5 mg by mouth 2 (two) times a day.    Yes [provider]  docusate sodium (COLACE) 100 MG capsule Take 100 mg by mouth daily.   Yes [provider]  finasteride (PROSCAR) 5 MG tablet Take 5 mg by mouth daily.   Yes [provider]  Iron-FA-B Cmp-C-Biot-Probiotic (FUSION PLUS PO) Take 1 capsule by mouth daily.   Yes [provider]  losartan (COZAAR) 50 MG tablet Take 50 mg by mouth daily.   Yes [provider]  Multiple Vitamin (MULTIVITAMIN) tablet Take 1 tablet by mouth daily.   Yes [provider]  Omega-3 Fatty Acids (FISH OIL) 1000 MG CAPS Take 1,000 mg by mouth daily.    Yes [provider]  omeprazole (PRILOSEC) 20 MG capsule Take 20 mg by mouth daily. 09/25/18  Yes [provider]  psyllium (METAMUCIL) 58.6 % packet Take 1 packet by mouth daily.   Yes [provider]  simvastatin (ZOCOR) 20 MG tablet Take 20 mg by mouth at bedtime.    Yes [provider]  Allergies:    Allergies  Allergen Reactions  . Codeine Phosphate [Codeine] Shortness Of Breath  . Sulfa Antibiotics Shortness Of Breath  . Asa [Aspirin] Other (See Comments)    MILD ANEMIA    Social History:   Social History   Socioeconomic History  . Marital status: Widowed    Spouse name: Not on file  . Number of children: 1  . Years of education: Not on file  . Highest education level: Not on file  Occupational History  . Occupation: retired    Comment: Hydrographic surveyor  Social Needs  .  Financial resource strain: Not on file  . Food insecurity    Worry: Not on file    Inability: Not on file  . Transportation needs    Medical: Not on file    Non-medical: Not on file  Tobacco Use  . Smoking status: Never Smoker  . Smokeless tobacco: Never Used  Substance and Sexual Activity  . Alcohol use: No  . Drug use: No  . Sexual activity: Never  Lifestyle  . Physical activity    Days per week: Not on file    Minutes per session: Not on file  . Stress: Not on file  Relationships  . Social Herbalist on phone: Not on file    Gets together: Not on file    Attends religious service: Not on file    Active member of club or organization: Not on file    Attends meetings of clubs or organizations: Not on file    Relationship status: Not on file  . Intimate partner violence    Fear of current or ex partner: Not on file    Emotionally abused: Not on file    Physically abused: Not on file    Forced sexual activity: Not on file  Other Topics Concern  . Not on file  Social History Narrative   Lives with is son    Family History:   The patient's family history includes Heart disease in his brother and sister; Hyperlipidemia in his brother and sister; Hypertension in his brother and sister.    ROS:  Please see the history of present illness.  All other ROS reviewed and negative.     Physical Exam/Data:   Vitals:   11/17/18 0842 11/17/18 0850  BP: (!) 187/84   Pulse: 81   Resp: 18   Temp: 98 F (36.7 C)   TempSrc: Oral   SpO2: 100%   Weight:  64.5 kg  Height: 5' 8.5" (1.74 m) 5' 8.5" (1.74 m)   No intake or output data in the 24 hours ending 11/17/18 1140 Last 3 Weights 11/17/2018 11/13/2018 11/09/2018  Weight (lbs) 142 lb 3.2 oz 142 lb 3.2 oz 140 lb  Weight (kg) 64.501 kg 64.501 kg 63.504 kg     Body mass index is 21.31 kg/m.  General:  Elderly male, in no acute distress HEENT: normal Lymph: no adenopathy Neck: no JVD Endocrine:  No thryomegaly  Vascular: No carotid bruits; FA pulses 2+ bilaterally Cardiac:  normal S1, S2; RRR; 3/6 systolic murmur at the RUSB Lungs:  clear to auscultation bilaterally, no wheezing, rhonchi or rales  Abd: soft, nontender, no hepatomegaly  Ext: no edema Musculoskeletal:  No deformities, BUE and BLE strength normal and equal Skin: warm and dry  Neuro:  CNs 2-12 intact, no focal abnormalities noted Psych:  Normal affect    EKG:  The ECG that was done 10/16/2018 was personally reviewed  and demonstrates NSR with first degree AV block, otherwise normal  Relevant CV Studies: 2D Echo: IMPRESSIONS    1. The left ventricle has normal systolic function with an ejection fraction of 60-65%. The cavity size was normal. There is mildly increased left ventricular wall thickness. Left ventricular diastolic Doppler parameters are consistent with impaired  relaxation. No evidence of left ventricular regional wall motion abnormalities.  2. The right ventricle has normal systolic function. The cavity was normal. There is no increase in right ventricular wall thickness.  3. Left atrial size was mildly dilated.  4. Mild calcification of the mitral valve leaflet. There is moderate mitral annular calcification present. No evidence of mitral valve stenosis. No significant mitral regurgitation.  5. The aortic valve is tricuspid. Severe calcifcation of the aortic valve. Aortic valve stenosis is moderate to severe. Mean gradient 32 mmHg, within moderate range, but calculated AVA 0.92 cm^2 suggests that stenosis could be more severe.  6. The aortic root is normal in size and structure.  7. The IVC was not visualized. Peak RA-RV gradient 28 mmHg.  Cath: Conclusion  1. Multivessel CAD with ostial occlusion of the RCA, collateralized with L--->R collaterals, moderate stenosis of the left main, moderate stenosis of the proximal LCx, and nonobstructive stenosis of the LAD 2. Positive DFR of the left main/LCx = 0.84 (Positive  less than 0.89) 3. Aortic stenosis with mean transvalvular gradient 17 mmhg, peak instantaneous gradient 28 mmHg  Plan: review films with Dr Roxy Manns to determine best treatment option for aortic stenosis and multivessel CAD, check 2D echo   CT Heart: FINDINGS: Aortic Valve: Trileaflet with severely calcified leaflets with severely restricted leaflets opening and no calcifications extending into the LVOT.  Aorta: Normal size with mild diffuse atherosclerotic plaque and calcifications and no dissection.  Sinotubular Junction: 28 x 28 mm  Ascending Thoracic Aorta: 38 x 37 mm  Aortic Arch: 30 x 29 mm  Descending Thoracic Aorta: 30 x 28 mm  Sinus of Valsalva Measurements:  Non-coronary: 32 mm  Right -coronary: 33 mm  Left -coronary: 36 mm  Coronary Artery Height above Annulus:  Left Main: 17 mm  Right Coronary: 17 mm  Virtual Basal Annulus Measurements:  Maximum/Minimum Diameter: 28.2 x 22.5 mm  Mean Diameter: 24.8 mm  Perimeter: 79.6 mm  Area: 483 mm2  Optimum Fluoroscopic Angle for Delivery: LAO 7 CAU 5.  IMPRESSION: 1. Trileaflet aortic valve with severely calcified leaflets with severely restricted leaflets opening and no calcifications extending into the LVOT. Aortic valve calcium score 3500 consistent with severe aortic stenosis. Annular measurements suitable for delivery of a 26 mm Edwards-SAPIEN 3 valve.  2. Sufficient coronary to annulus distance.  3. Optimum Fluoroscopic Angle for Delivery: LAO 7 CAU 5.  4. No thrombus in the left atrial appendage.   Laboratory Data:  High Sensitivity Troponin:  No results for input(s): TROPONINIHS in the last 720 hours.    Cardiac EnzymesNo results for input(s): TROPONINI in the last 168 hours. No results for input(s): TROPIPOC in the last 168 hours.  Chemistry Recent Labs  Lab 11/13/18 1453  NA 127*  K 4.3  CL 93*  CO2 21*  GLUCOSE 101*  BUN 11  CREATININE 0.83  CALCIUM 9.2   GFRNONAA >60  GFRAA >60  ANIONGAP 13    Recent Labs  Lab 11/13/18 1453  PROT 6.2*  ALBUMIN 3.9  AST 15  ALT 21  ALKPHOS 45  BILITOT 0.7   Hematology Recent Labs  Lab 11/13/18 1453  WBC 5.5  RBC 3.54*  HGB 11.9*  HCT 35.1*  MCV 99.2  MCH 33.6  MCHC 33.9  RDW 12.2  PLT 293   BNP Recent Labs  Lab 11/13/18 1452  BNP 168.4*    DDimer No results for input(s): DDIMER in the last 168 hours.   Radiology/Studies:  No results found.  Assessment and Plan:   85 yo male with severe, symptomatic aortic stenosis, stage D1 disease. He has undergone formal surgical consultation with Dr Roxy Manns and is not consider a candidate for multivessel CABG and surgical AVR based on his advanced age and personal wishes to not undergo surgery. He has been counseled extensively about treatment options and has provided full informed consent for TAVR. After review of all of his preoperative studies, he does not have adequate transfemoral access for TAVR and we plan a left transaxillary approach for implantation of a 26 mm transcatheter valve.   For questions or updates, please contact [place] Please consult [email or web address] for contact info under        Signed, Sherren Mocha, MD  11/17/2018 11:40 AM

## 2018-11-17 NOTE — Anesthesia Procedure Notes (Signed)
Arterial Line Insertion Performed by: CRNA  Patient location: Pre-op. Preanesthetic checklist: patient identified, IV checked, site marked, risks and benefits discussed, surgical consent, monitors and equipment checked, pre-op evaluation, timeout performed and anesthesia consent Right, radial was placed Catheter size: 20 G Hand hygiene performed , maximum sterile barriers used  and Seldinger technique used Allen's test indicative of satisfactory collateral circulation Attempts: 1 Procedure performed without using ultrasound guided technique. Following insertion, dressing applied and Biopatch. Post procedure assessment: normal  Patient tolerated the procedure well with no immediate complications.     

## 2018-11-17 NOTE — Op Note (Signed)
[facility]   MULTIDISCIPLINARY HEART VALVE TEAM   TAVR OPERATIVE NOTE   Date of Procedure:  11/17/2018  Preoperative Diagnosis: Severe Aortic Stenosis   Postoperative Diagnosis: Same   Procedure:    Transcatheter Aortic Valve Replacement - Left Transaxillary Approach  Edwards Sapien 3 UltraTHV (size 26 mm, model # 9750TFX, serial # [id number])   Co-Surgeons:  Valentina Gu. Roxy Manns, MD and Sherren Mocha, MD  Anesthesiologist:  Adele Barthel, MD  Echocardiographer:  Jenkins Rouge, MD  Pre-operative Echo Findings:  Severe aortic stenosis  Normal left ventricular systolic function  Post-operative Echo Findings:  No paravalvular leak  Normal left ventricular systolic function   BRIEF CLINICAL NOTE AND INDICATIONS FOR SURGERY  Patient is a 85 year old male with history of aortic stenosis, hypertension, hyperlipidemia, iron deficient anemia, and thoracic aortic ectasia who returns to the office today for follow-up of severe symptomatic aortic stenosis.  Patient was initially referred for surgical consultation approximately 1 year ago and evaluated on November 03, 2017.  Echocardiogram performed at that time revealed normal left ventricular systolic function with hemodynamic findings suggestive of moderate aortic stenosis.  At that time the patient denied any symptoms of exertional shortness of breath or chest discomfort.  We elected to follow the patient carefully.  Over the past year the patient has developed worsening fatigue, generalized weakness, exertional shortness of breath, loss of appetite, weight loss, and occasional dizzy spells.  Follow-up echocardiogram performed at [facility] Aug 31, 2018 revealed significant progression and severity of aortic stenosis with peak velocity across the aortic valve measured 3.9 m/s corresponding to mean transvalvular gradient estimated 38 mmHg.  I last saw the patient in the office on September 21, 2018 and discussed these  findings at length with the patient and his son.  We subsequently made a decision to proceed with further diagnostic testing to consider transcatheter aortic valve replacement.  The patient underwent transthoracic echocardiogram and left and right heart catheterization on October 16, 2018.  Both echocardiogram and diagnostic cardiac catheterization confirmed the presence of aortic stenosis but also revealed the presence of significant coronary artery disease including 100% chronic occlusion of the right coronary artery with 50 to 60% stenosis of the left main coronary artery.  Right heart pressures were normal.  CT angiography was performed and the patient returns to the office today to review the results of these tests and discuss treatment options further.  During the course of the patient's preoperative work up they have been evaluated comprehensively by a multidisciplinary team of specialists coordinated through the [facility] in the [facility].  They have been demonstrated to suffer from symptomatic severe aortic stenosis as noted above. The patient has been counseled extensively as to the relative risks and benefits of all options for the treatment of severe aortic stenosis including long term medical therapy, conventional surgery for aortic valve replacement, and transcatheter aortic valve replacement.  All questions have been answered, and the patient provides full informed consent for the operation as described.   DETAILS OF THE OPERATIVE PROCEDURE  PREPARATION:    The patient is brought to the operating room on the above mentioned date and appropriate monitoring was established by the anesthesia team. The patient is placed in the supine position on the operating table.  Intravenous antibiotics are administered.  General endotracheal anesthesia is induced uneventfully.  A Foley catheter is placed.  Baseline transesophageal echocardiogram was  performed. The patient's chest, abdomen, both groins,  and both lower extremities are prepared and draped in a sterile manner. A time out procedure is performed.   PERIPHERAL ACCESS:    Using the modified Seldinger technique, femoral arterial and venous access was obtained with placement of 6 Fr sheaths on the right and left side.  A pigtail diagnostic catheter was passed through the left arterial sheath under fluoroscopic guidance into the aortic root.  A temporary transvenous pacemaker catheter was passed through the right femoral venous sheath under fluoroscopic guidance into the right ventricle.  The pacemaker was tested to ensure stable lead placement and pacemaker capture. Aortic root angiography was performed in order to determine the optimal angiographic angle for valve deployment.   TRANSAXILLARY ACCESS:   A small oblique incision is made in the left deltopectoral groove.  The incision is completed through the subcutaneous tissues with electrocautery.  The pectoralis muscle fibers are split longitudinally and the deep pectoralis fascia incised.  Sharp dissection is utilized to dissect beneath the pectoralis fascia.  The insertion of the pectoralis minor muscle was retracted laterally.  The left axillary artery is identified and encircled with an elastic tape.  A pair of Abbott Perclose percutaneous closure devices were placed and a 6 French sheath replaced into the axillary artery.  The patient was heparinized systemically and ACT verified > 250 seconds.  The left axillary artery is cannulated using a Seldinger technique and a guidewire advanced into the aortic root using fluoroscopy.  An 8 French sheath is passed over the guidewire into the axillary artery.  An AL-2 catheter was used to direct a straight-tip exchange length wire across the native aortic valve into the left ventricle. This was exchanged out for a pigtail catheter and position was confirmed in the LV apex.  The pigtail catheter  was exchanged for an Amplatz Extra-stiff wire in the LV apex.  A 14 Fr transfemoral E-sheath was introduced into the left axillary artery after progressively dilating over the Amplatz Extra-stiff wire. Echocardiography was utilized to confirm appropriate wire position and no sign of entanglement in the mitral subvalvular apparatus.   TRANSCATHETER HEART VALVE DEPLOYMENT:   An Edwards Sapien 3 Ultra transcatheter heart valve (size 26 mm, model #9750TFX, serial #[id number]) was prepared and crimped per manufacturer's guidelines, and the proper orientation of the valve is confirmed on the [employer] delivery system. The valve was advanced through the introducer sheath using normal technique until in an appropriate position in the aorta beyond the sheath tip. The balloon was then retracted and using the fine-tuning wheel was centered on the valve. The valve was then advanced across the aortic valve annulus. The Commander catheter was retracted using normal technique. Once final position of the valve has been confirmed by angiographic assessment, the valve is deployed while temporarily holding ventilation and during rapid ventricular pacing to maintain systolic blood pressure < 50 mmHg and pulse pressure < 10 mmHg. The balloon inflation is held for >3 seconds after reaching full deployment volume. Once the balloon has fully deflated the balloon is retracted into the ascending aorta and valve function is assessed using echocardiography. There is felt to be no paravalvular leak and no central aortic insufficiency.  The patient's hemodynamic recovery following valve deployment is good.  The deployment balloon and guidewire are both removed.    PROCEDURE COMPLETION:   The sheath was removed and axillary artery closure performed.  Protamine was administered once arterial repair was complete.  The small incision in the left deltopectoral groove was closed in multiple  layers and the skin closed with subcuticular  skin closure.  The temporary pacemaker, pigtail catheters and femoral sheaths were removed with manual pressure used for hemostasis.    The patient tolerated the procedure well and is transported to the post-anesthesia care in stable condition. There were no immediate intraoperative complications. All sponge instrument and needle counts are verified correct at completion of the operation.   No blood products were administered during the operation.  The patient received a total of 40 mL of intravenous contrast during the procedure.   Rexene Alberts, MD 11/17/2018 1:33 PM

## 2018-11-17 NOTE — Op Note (Signed)
[facility]   MULTIDISCIPLINARY HEART VALVE TEAM   TAVR OPERATIVE NOTE   Date of Procedure:  11/17/2018  Preoperative Diagnosis: Severe Aortic Stenosis   Postoperative Diagnosis: Same   Procedure:    Transcatheter Aortic Valve Replacement - Transaxillary Approach  Edwards Sapien 3 UltraTHV (size 26 mm, model # 9750TFX, serial # [id number])   Co-Surgeons:  Valentina Gu. Roxy Manns, MD, MD and Sherren Mocha, MD  Anesthesiologist:  Adele Barthel, MD  Echocardiographer:  Jenkins Rouge, MD  Pre-operative Echo Findings:  Severe aortic stenosis  Normal/vigorous left ventricular systolic function  Post-operative Echo Findings:  No paravalvular leak  unchanged left ventricular systolic function  BRIEF CLINICAL NOTE AND INDICATIONS FOR SURGERY  Please see the complete note of Dr Roxy Manns.   DETAILS OF THE OPERATIVE PROCEDURE  PREPARATION:   The patient is brought to the operating room on the above mentioned date and central monitoring was established by the anesthesia team including placement of venous access and radial arterial line. The patient is placed in the supine position on the operating table.  Intravenous antibiotics are administered. General endotracheal anesthesia is induced uneventfully.  Baseline transesophageal echocardiogram is performed. The patient's chest, abdomen, both groins, and both lower extremities are prepared and draped in a sterile manner. A time out procedure is performed.   PERIPHERAL ACCESS:   Using ultrasound guidance, femoral arterial and venous access is obtained with placement of an 8 Fr sheath in the right femoral vein and a 6 Fr sheath in the left femoral artery.  A pigtail diagnostic catheter was passed through the femoral arterial sheath under fluoroscopic guidance into the aortic root.  A temporary transvenous pacemaker catheter was passed through the femoral venous sheath under fluoroscopic guidance into the right ventricle.  The  pacemaker was tested to ensure stable lead placement and pacemaker capture. Aortic root angiography was performed in order to determine the optimal angiographic angle for valve deployment.  TRANSAXILLARY ACCESS: Please see Dr Guy Sandifer note  BALLOON AORTIC VALVULOPLASTY:  Not performed  TRANSCATHETER HEART VALVE DEPLOYMENT:  An Edwards Sapien 3 transcatheter heart valve (size 26 mm) was prepared and crimped per manufacturer's guidelines, and the proper orientation of the valve is confirmed on the [employer] delivery system. The valve was advanced through the introducer sheath using normal technique until in an appropriate position in the thoracic aorta beyond the sheath tip. The balloon was then retracted and using the fine-tuning wheel was centered on the valve. The valve was then carefully positioned across the aortic valve annulus. The Commander catheter was retracted using normal technique. Once final position of the valve has been confirmed by angiographic assessment, the valve is deployed while temporarily holding ventilation and during rapid ventricular pacing to maintain systolic blood pressure < 50 mmHg and pulse pressure < 10 mmHg. The balloon inflation is held for >3 seconds after reaching full deployment volume. Once the balloon has fully deflated the balloon is retracted into the ascending aorta and valve function is assessed using echocardiography. The patient's hemodynamic recovery following valve deployment is good.  The deployment balloon and guidewire are both removed. Echo demostrated acceptable post-procedural gradients, stable mitral valve function, and no aortic insufficiency.   PROCEDURE COMPLETION:  The sheath was removed and femoral artery closure is performed using the 2 previously deployed Perclose devices.  Protamine is administered once femoral arterial repair was complete. The site is clear with no evidence of bleeding or hematoma after the sutures are tightened. The  temporary pacemaker and  pigtail catheters are removed.  Sheaths are removed and manual pressure used for hemostasis. The patient tolerated the procedure well and is transported to the surgical intensive care in stable condition. There were no immediate intraoperative complications. All sponge instrument and needle counts are verified correct at completion of the operation.   The patient received a total of 40 mL of intravenous contrast during the procedure.   Sherren Mocha, MD 11/17/2018 1:03 PM

## 2018-11-17 NOTE — Anesthesia Preprocedure Evaluation (Addendum)
Anesthesia Evaluation  Patient identified by MRN, date of birth, ID band Patient awake    Reviewed: Allergy & Precautions, NPO status , Patient's Chart, lab work & pertinent test results  Airway Mallampati: II  TM Distance: >3 FB Neck ROM: Full    Dental  (+) Poor Dentition, Missing   Pulmonary neg pulmonary ROS,    Pulmonary exam normal breath sounds clear to auscultation       Cardiovascular hypertension, Pt. on medications + CAD and + Peripheral Vascular Disease  + Valvular Problems/Murmurs AS  Rhythm:Regular Rate:Normal + Systolic murmurs ECG: SR, rate 79  ECHO: 1. The left ventricle has normal systolic function with an ejection fraction of 60-65%. The cavity size was normal. There is mildly increased left ventricular wall thickness. Left ventricular diastolic Doppler parameters are consistent with impaired  relaxation. No evidence of left ventricular regional wall motion abnormalities. 2. The right ventricle has normal systolic function. The cavity was normal. There is no increase in right ventricular wall thickness. 3. Left atrial size was mildly dilated. 4. Mild calcification of the mitral valve leaflet. There is moderate mitral annular calcification present. No evidence of mitral valve stenosis. No significant mitral regurgitation. 5. The aortic valve is tricuspid. Severe calcifcation of the aortic valve. Aortic valve stenosis is moderate to severe. Mean gradient 32 mmHg, within moderate range, but calculated AVA 0.92 cm^2 suggests that stenosis could be more severe. 6. The aortic root is normal in size and structure. 7. The IVC was not visualized. Peak RA-RV gradient 28 mmHg.   CATH: 1. Multivessel CAD with ostial occlusion of the RCA, collateralized with L--->R collaterals, moderate stenosis of the left main, moderate stenosis of the proximal LCx, and nonobstructive stenosis of the LAD 2. Positive DFR of the left main/LCx  = 0.84 (Positive less than 0.89) 3. Aortic stenosis with mean transvalvular gradient 17 mmhg, peak instantaneous gradient 28 mmHg   Neuro/Psych Anxiety negative neurological ROS     GI/Hepatic negative GI ROS, Neg liver ROS,   Endo/Other  negative endocrine ROSHyponatremia   Renal/GU negative Renal ROS     Musculoskeletal negative musculoskeletal ROS (+)   Abdominal   Peds  Hematology  (+) anemia , HLD   Anesthesia Other Findings Severe Aortic Stenosis  Reproductive/Obstetrics                            Anesthesia Physical Anesthesia Plan  ASA: IV  Anesthesia Plan: General   Post-op Pain Management:    Induction: Intravenous  PONV Risk Score and Plan: 2 and Ondansetron, Dexamethasone and Treatment may vary due to age or medical condition  Airway Management Planned: Oral ETT  Additional Equipment: Arterial line and TEE  Intra-op Plan:   Post-operative Plan: Extubation in OR  Informed Consent: I have reviewed the patients History and Physical, chart, labs and discussed the procedure including the risks, benefits and alternatives for the proposed anesthesia with the patient or authorized representative who has indicated his/her understanding and acceptance.     Dental advisory given  Plan Discussed with: CRNA  Anesthesia Plan Comments:      Anesthesia Quick Evaluation

## 2018-11-17 NOTE — Discharge Instructions (Signed)

## 2018-11-17 NOTE — Transfer of Care (Signed)
Immediate Anesthesia Transfer of Care Note  Patient: Stephen Wolf  Procedure(s) Performed: TRANSCATHETER AORTIC VALVE REPLACEMENT,SUBCLAVIAN (N/A Chest) TRANSESOPHAGEAL ECHOCARDIOGRAM (TEE) (N/A )  Patient Location: Cath Lab  Anesthesia Type:General  Level of Consciousness: awake and alert   Airway & Oxygen Therapy: Patient Spontanous Breathing and Patient connected to face mask oxygen  Post-op Assessment: Report given to RN and Post -op Vital signs reviewed and stable  Post vital signs: Reviewed and stable  Last Vitals:  Vitals Value Taken Time  BP    Temp    Pulse    Resp    SpO2      Last Pain:  Vitals:   11/17/18 0850  TempSrc:   PainSc: 0-No pain      Patients Stated Pain Goal: 3 (18/40/37 5436)  Complications: No apparent anesthesia complications

## 2018-11-17 NOTE — Progress Notes (Signed)
Pt arrived from cath lab. Pt oriented x4. Pt denies pain. CHG bath given, gown changed.  Telebox 22 applied/ccmd notified. Vitals stable. All surgical sites are level 0. Pt belongings in closet. Pt oriented to room. Call bell within reach.  Will continue to monitor.  Jerald Kief, RN

## 2018-11-17 NOTE — Progress Notes (Signed)
Site area:Right radial arterial  Site Prior to Removal: level 0  Pressure Applied For:  15 mins   Minutes Beginning at: 3403  Manual:Yes  Patient Status During Pull: without complaints, A/O, resting comfortably  Post Pull Site: level 0  Post Pull Instructions Given:  yes, verbalized and demonstrated understanding  Post Pull Pulses Present:  yes, radial 2+  Dressing Applied:  Gauze, tegaderm

## 2018-11-17 NOTE — Anesthesia Procedure Notes (Signed)
Procedure Name: Intubation Date/Time: 11/17/2018 11:34 AM Performed by: Valda Favia, CRNA Pre-anesthesia Checklist: Patient identified, Emergency Drugs available, Suction available, Patient being monitored and Timeout performed Patient Re-evaluated:Patient Re-evaluated prior to induction Oxygen Delivery Method: Circle system utilized Preoxygenation: Pre-oxygenation with 100% oxygen Induction Type: IV induction Ventilation: Mask ventilation without difficulty Laryngoscope Size: Mac and 4 Grade View: Grade I Tube type: Oral Tube size: 8.0 mm Number of attempts: 1 Airway Equipment and Method: Stylet Placement Confirmation: ETT inserted through vocal cords under direct vision,  positive ETCO2 and breath sounds checked- equal and bilateral Secured at: 22 cm Tube secured with: Tape Dental Injury: Teeth and Oropharynx as per pre-operative assessment

## 2018-11-17 NOTE — Progress Notes (Signed)
  [place] VALVE TEAM  Patient doing well s/p TAVR. He is hemodynamically stable. BP elevated requiring labetalol and nitro gtt. Now weaned off. Will resume home amlodipine for BP control. Groin site / subclavian site stable. ECG w/ no high grade block. Plan to discontinue arterial line discontinued and transfer to 4E. Plan for early ambulation after bedrest completed and hopeful discharge over the next 24-48 hours.   Angelena Form PA-C  [facility]  Pager [phone]

## 2018-11-17 NOTE — Progress Notes (Signed)
  Echocardiogram Echocardiogram Transesophageal has been performed.  Darlina Sicilian M 11/17/2018, 1:21 PM

## 2018-11-18 ENCOUNTER — Inpatient Hospital Stay (HOSPITAL_COMMUNITY): Payer: Medicare Other

## 2018-11-18 DIAGNOSIS — Z952 Presence of prosthetic heart valve: Secondary | ICD-10-CM

## 2018-11-18 LAB — URINE CULTURE
Culture: <10000 — AB
Special Requests: NORMAL

## 2018-11-18 LAB — BASIC METABOLIC PANEL
Anion gap: 9 (ref 5–15)
BUN: 11 mg/dL (ref 8–23)
CO2: 25 mmol/L (ref 22–32)
Calcium: 9 mg/dL (ref 8.9–10.3)
Chloride: 95 mmol/L — ABNORMAL LOW (ref 98–111)
Creatinine, Ser: 0.91 mg/dL (ref 0.61–1.24)
GFR calc Af Amer: >60 mL/min (ref >60)
GFR calc non Af Amer: >60 mL/min (ref >60)
Glucose, Bld: 113 mg/dL — ABNORMAL HIGH (ref 70–99)
Potassium: 4.3 mmol/L (ref 3.5–5.1)
Sodium: 129 mmol/L — ABNORMAL LOW (ref 135–145)

## 2018-11-18 LAB — CBC
HCT: 33.2 % — ABNORMAL LOW (ref 39.0–52.0)
Hemoglobin: 11.9 g/dL — ABNORMAL LOW (ref 13.0–17.0)
MCH: 34.2 pg — ABNORMAL HIGH (ref 26.0–34.0)
MCHC: 35.8 g/dL (ref 30.0–36.0)
MCV: 95.4 fL (ref 80.0–100.0)
Platelets: 206 10*3/uL (ref 150–400)
RBC: 3.48 MIL/uL — ABNORMAL LOW (ref 4.22–5.81)
RDW: 12.5 % (ref 11.5–15.5)
WBC: 8.4 10*3/uL (ref 4.0–10.5)
nRBC: 0 % (ref 0.0–0.2)

## 2018-11-18 LAB — ECHOCARDIOGRAM COMPLETE
Height: 68.5 in
Weight: 2217.6 oz

## 2018-11-18 LAB — MAGNESIUM: Magnesium: 1.9 mg/dL (ref 1.7–2.4)

## 2018-11-18 MED ORDER — ASPIRIN 81 MG PO TBEC: 81.0000 mg | DELAYED_RELEASE_TABLET | Freq: Every day | ORAL | 1 refills | Status: DC

## 2018-11-18 MED ORDER — AMOXICILLIN 500 MG PO TABS: 2000.0000 mg | ORAL_TABLET | ORAL | 12 refills | Status: AC

## 2018-11-18 MED ORDER — CLOPIDOGREL BISULFATE 75 MG PO TABS: 75.0000 mg | ORAL_TABLET | Freq: Every day | ORAL | 1 refills | Status: DC

## 2018-11-18 MED ORDER — PANTOPRAZOLE SODIUM 40 MG PO TBEC: 40.0000 mg | DELAYED_RELEASE_TABLET | Freq: Every day | ORAL | 1 refills | Status: AC

## 2018-11-18 MED FILL — Magnesium Sulfate Inj 50%: INTRAMUSCULAR | Qty: 10 | Status: AC

## 2018-11-18 MED FILL — Potassium Chloride Inj 2 mEq/ML: INTRAVENOUS | Qty: 40 | Status: AC

## 2018-11-18 MED FILL — Heparin Sodium (Porcine) Inj 1000 Unit/ML: INTRAMUSCULAR | Qty: 30 | Status: AC

## 2018-11-18 MED FILL — AMOXICILLIN 500 MG CAPS: 500 | 3 days supply | Qty: 12 | Fill #0

## 2018-11-18 MED FILL — PANTOPRAZOLE SOD DR 40 MG T: 40 | 90 days supply | Qty: 90 | Fill #0

## 2018-11-18 MED FILL — ASPIRIN LOW DOSE 81 MG TBEC: 81 | 90 days supply | Qty: 90 | Fill #0

## 2018-11-18 MED FILL — CLOPIDOGREL 75 MG TABLET: 75 | 90 days supply | Qty: 90 | Fill #0

## 2018-11-18 NOTE — Progress Notes (Signed)
Pt provided discharge instructions and education, Stephen Wolf also provided education (Pt son). Pt iv's removed and intact. telebox removed/ccmd notified. Pt vitals stable. Pt denies complaints. [facility] will meet pt and son [facility].  Pt has all belongings. Pt to be tx via wheelchair to [place].  Jerald Kief, RN

## 2018-11-18 NOTE — Anesthesia Postprocedure Evaluation (Signed)
Anesthesia Post Note  Patient: JORDANY RUSSETT  Procedure(s) Performed: TRANSCATHETER AORTIC VALVE REPLACEMENT,SUBCLAVIAN (N/A Chest) TRANSESOPHAGEAL ECHOCARDIOGRAM (TEE) (N/A )     Patient location during evaluation: PACU Anesthesia Type: General Level of consciousness: awake Pain management: pain level controlled Vital Signs Assessment: post-procedure vital signs reviewed and stable Respiratory status: spontaneous breathing, nonlabored ventilation, respiratory function stable and patient connected to nasal cannula oxygen Cardiovascular status: blood pressure returned to baseline and stable Postop Assessment: no apparent nausea or vomiting Anesthetic complications: no    Last Vitals:  Vitals:   11/18/18 0913 11/18/18 0930  BP:  138/61  Pulse:    Resp: 18   Temp:    SpO2:      Last Pain:  Vitals:   11/18/18 0913  TempSrc:   PainSc: 0-No pain                 Ryan P Ellender

## 2018-11-18 NOTE — Discharge Summary (Addendum)
[facility] VALVE TEAM  Discharge Summary    Patient ID: Stephen Wolf MRN: [id number]; DOB: [birth year]  Admit date: 11/17/2018 Discharge date: 11/18/2018  Primary Care Provider: Secundino Ginger, PA-C  Primary Cardiologist: Claudie Leach / Dr. Burt Knack & Dr. Roxy Manns (TAVR)  Discharge Diagnoses    Principal Problem:   S/P TAVR (transcatheter aortic valve replacement) Active Problems:   PAD (peripheral artery disease) ([place])   Anemia   Coronary artery disease involving left main coronary artery   Severe aortic stenosis   Hypertension   Hyperlipidemia   History of colon cancer   Hyponatremia   Acute on chronic diastolic heart failure (HCC)   Allergies Allergies  Allergen Reactions   Codeine Phosphate [Codeine] Shortness Of Breath   Sulfa Antibiotics Shortness Of Breath   Asa [Aspirin] Other (See Comments)    MILD ANEMIA    Diagnostic Studies/Procedures    TAVR OPERATIVE NOTE   Date of Procedure:                11/17/2018  Preoperative Diagnosis:      Severe Aortic Stenosis   Postoperative Diagnosis:    Same   Procedure:        Transcatheter Aortic Valve Replacement - Left Transaxillary Approach             Edwards Sapien 3 UltraTHV (size 26 mm, model # 9750TFX, serial # [id number])              Co-Surgeons:                        Valentina Gu. Roxy Manns, MD and Sherren Mocha, MD  Anesthesiologist:                  Adele Barthel, MD  Echocardiographer:              Jenkins Rouge, MD  Pre-operative Echo Findings: ? Severe aortic stenosis ? Normal left ventricular systolic function  Post-operative Echo Findings: ? No paravalvular leak ? Normal left ventricular systolic function  _____________   11/18/18: completed but pending at the time of discharge   History of Present Illness     Stephen Wolf is a 85 y.o. male with a history of HTN, HLD, PAD, iron deficiency anemia, multivessel CAD, rheumatic fever during  childhood and severe AS who presented to [facility] on 11/17/18 for planned TAVR.    The patient has been followed by Roque Cash PA-C for aortic stenosis. He was initially referred for surgical consultation about 1 year ago. At that time, he denies any symptoms and it was elected to follow the patient carefully.  Over the past year the patient has developed worsening fatigue, generalized weakness, exertional shortness of breath, loss of appetite, weight loss, and occasional dizzy spells. Follow-up echocardiogram performed at [facility] Aug 31, 2018 revealed significant progression and severity of aortic stenosis with peak velocity across the aortic valve measured 3.9 m/s corresponding to mean transvalvular gradient estimated 38 mmHg. Cath 10/16/18 showed significant CAD including 100% chronic occlusion of the right coronary artery with 50-60% stenosis of the left main coronary artery.  Plan want to treat CAD medically.   The patient has been evaluated by the multidisciplinary valve team and felt to have severe, symptomatic aortic stenosis and to be a suitable candidate for TAVR via the left subclavian approach, which was set up for 11/18/18.    Hospital Course  Consultants: none    Severe AS: s/p successful TAVR with a 26 mm Edwards Sapien Ultra THV via the left subclavian approach on 11/17/18. Post operative echo pending. Groin site and subclavian site are stable. ECG with NSR w/ old 1st deg AV block and no high grade heart block. Started on ASA and plavix. All medications were brought to bedside prior to discharge via the [facility].   HTN: BP initially elevated post op. He was treated with labetalol and IV nitro. Now back on home regimen   HLD: continue statin   CAD: he has significant, multivessel CAD. Continue medical therapy.   Acute on chronic diastolic CHF: as evidenced by a mildly elevated BNP on pre admission lab work. This has been treated with TAVR. He is not on diuretic  therapy as an outpatient.   Hyponatremia: this appears to be chronic by review of labs in our system and care everywhere. Slightly improved today at 129.   GERD: prilosec changed to protonix given potential drug drug interaction with plavix.   Incidental findings: noted on pre TAVR CT scan. Two small areas of urothelial hyperenhancement associated with the urinary bladder and a left-sided bladder diverticulum. This is nonspecific, but further clinical evaluation is recommended to exclude the possibility of urothelial neoplasm. Will discuss if urology consultation is necessary in this 85 year old gentleman.  Smoothly marginated right lower lobe pulmonary nodule with a mean diameter of 6 mm. Non-contrast chest CT at 6-12 months is recommended. If the nodule is stable at time of repeat CT, then future CT at 18-24 months (from today's scan) is considered optional for low-risk patients, but is recommended for high-risk patients. Patient is a never smoker. I will discuss this with him in the outpatient setting. _____________  Discharge Vitals Blood pressure 138/61, pulse 73, temperature 98.1 F (36.7 C), temperature source Oral, resp. rate 18, height 5' 8.5" (1.74 m), weight 62.9 kg, SpO2 99 %.  Filed Weights   11/17/18 0850 11/18/18 0200  Weight: 64.5 kg 62.9 kg    PHYSICAL EXAM:    GEN: Well nourished, well developed, in no acute distress HEENT: normal Neck: no JVD or masses Cardiac: RRR; no murmurs, rubs, or gallops,no edema  Respiratory:  clear to auscultation bilaterally, normal work of breathing GI: soft, nontender, nondistended, + BS MS: no deformity or atrophy Skin: warm and dry, no rash. Groin site and subclavian site healing well without evidence of hematoma or infection Neuro:  Alert and Oriented x 3, Strength and sensation are intact Psych: euthymic mood, full affect   Labs & Radiologic Studies    CBC Recent Labs    11/17/18 1727 11/18/18 0355  WBC 11.8* 8.4  HGB 10.6*  11.9*  HCT 30.1* 33.2*  MCV 95.6 95.4  PLT 256 540   Basic Metabolic Panel Recent Labs    11/17/18 1355 11/18/18 0355  NA 127* 129*  K 4.1 4.3  CL 93* 95*  CO2  --  25  GLUCOSE 141* 113*  BUN 13 11  CREATININE 0.80 0.91  CALCIUM  --  9.0  MG  --  1.9   Liver Function Tests No results for input(s): AST, ALT, ALKPHOS, BILITOT, PROT, ALBUMIN in the last 72 hours. No results for input(s): LIPASE, AMYLASE in the last 72 hours. Cardiac Enzymes No results for input(s): CKTOTAL, CKMB, CKMBINDEX, TROPONINI in the last 72 hours. BNP Invalid input(s): POCBNP D-Dimer No results for input(s): DDIMER in the last 72 hours. Hemoglobin A1C No results  for input(s): HGBA1C in the last 72 hours. Fasting Lipid Panel No results for input(s): CHOL, HDL, LDLCALC, TRIG, CHOLHDL, LDLDIRECT in the last 72 hours. Thyroid Function Tests No results for input(s): TSH, T4TOTAL, T3FREE, THYROIDAB in the last 72 hours.  Invalid input(s): FREET3 _____________  Dg Chest 2 View  Result Date: 11/13/2018 CLINICAL DATA:  Preoperative, aortic valve replacement EXAM: CHEST - 2 VIEW COMPARISON:  10/09/2017 FINDINGS: The heart size and mediastinal contours are within normal limits. Both lungs are clear. Disc degenerative disease of the thoracic spine. IMPRESSION: No acute abnormality of the lungs. Electronically Signed   By: Eddie Candle M.D.   On: 11/13/2018 16:26   Ct Coronary Morph W/cta Cor W/score W/ca W/cm &/or Wo/cm  Addendum Date: 10/26/2018   ADDENDUM REPORT: 10/26/2018 15:26 CLINICAL DATA:  85 year old male with severe aortic stenosis, CAD, s/p CABG being evaluated for a TAVR procedure. EXAM: Cardiac TAVR CT TECHNIQUE: The patient was scanned on a [employer]. A 120 kV retrospective scan was triggered in the descending thoracic aorta at 111 HU's. Gantry rotation speed was 250 msecs and collimation was .6 mm. No beta blockade or nitro were given. The 3D data set was reconstructed in 5% intervals  of the R-R cycle. Systolic and diastolic phases were analyzed on a dedicated work station using MPR, MIP and VRT modes. The patient received 80 cc of contrast. FINDINGS: Aortic Valve: Trileaflet with severely calcified leaflets with severely restricted leaflets opening and no calcifications extending into the LVOT. Aorta: Normal size with mild diffuse atherosclerotic plaque and calcifications and no dissection. Sinotubular Junction: 28 x 28 mm Ascending Thoracic Aorta: 38 x 37 mm Aortic Arch: 30 x 29 mm Descending Thoracic Aorta: 30 x 28 mm Sinus of Valsalva Measurements: Non-coronary: 32 mm Right -coronary: 33 mm Left -coronary: 36 mm Coronary Artery Height above Annulus: Left Main: 17 mm Right Coronary: 17 mm Virtual Basal Annulus Measurements: Maximum/Minimum Diameter: 28.2 x 22.5 mm Mean Diameter: 24.8 mm Perimeter: 79.6 mm Area: 483 mm2 Optimum Fluoroscopic Angle for Delivery: LAO 7 CAU 5. IMPRESSION: 1. Trileaflet aortic valve with severely calcified leaflets with severely restricted leaflets opening and no calcifications extending into the LVOT. Aortic valve calcium score 3500 consistent with severe aortic stenosis. Annular measurements suitable for delivery of a 26 mm Edwards-SAPIEN 3 valve. 2. Sufficient coronary to annulus distance. 3. Optimum Fluoroscopic Angle for Delivery: LAO 7 CAU 5. 4. No thrombus in the left atrial appendage. Electronically Signed   By: Ena Dawley   On: 10/26/2018 15:26   Result Date: 10/26/2018 EXAM: OVER-READ INTERPRETATION  CT CHEST The following report is an over-read performed by radiologist Dr. Vinnie Langton of [facility], [place] on 10/26/2018. This over-read does not include interpretation of cardiac or coronary anatomy or pathology. The coronary calcium score/coronary CTA interpretation by the cardiologist is attached. COMPARISON:  None. FINDINGS: Extracardiac findings will be described separately under dictation for contemporaneously obtained CTA chest, abdomen  and pelvis. IMPRESSION: Please see separate dictation for contemporaneously obtained CTA chest, abdomen and pelvis dated 10/26/2018 for full description of relevant extracardiac findings. Electronically Signed: By: Vinnie Langton M.D. On: 10/26/2018 10:39   Dg Chest Port 1 View  Result Date: 11/17/2018 CLINICAL DATA:  Status post TAVR EXAM: PORTABLE CHEST 1 VIEW COMPARISON:  November 13, 2018 FINDINGS: The lungs are clear. No pneumothorax or focal airspace consolidation. No pleural effusion. The patient is status post TAVR with stent in place. There is atherosclerotic calcification the aortic knob with  a tortuous descending aorta. the cardiomediastinal silhouette is otherwise unremarkable. IMPRESSION: Status post TAVR.  No acute cardiopulmonary process Electronically Signed   By: Prudencio Pair M.D.   On: 11/17/2018 17:56   Ct Angio Chest Aorta W &/or Wo Contrast  Result Date: 10/26/2018 CLINICAL DATA:  85 year old male with history of severe aortic stenosis. Preprocedural study prior to potential transcatheter aortic valve replacement (TAVR) procedure. EXAM: CT ANGIOGRAPHY CHEST, ABDOMEN AND PELVIS TECHNIQUE: Multidetector CT imaging through the chest, abdomen and pelvis was performed using the standard protocol during bolus administration of intravenous contrast. Multiplanar reconstructed images and MIPs were obtained and reviewed to evaluate the vascular anatomy. CONTRAST:  155mL OMNIPAQUE IOHEXOL 350 MG/ML SOLN COMPARISON:  None. FINDINGS: CTA CHEST FINDINGS Cardiovascular: Heart size is normal. There is no significant pericardial fluid, thickening or pericardial calcification. There is aortic atherosclerosis, as well as atherosclerosis of the great vessels of the mediastinum and the coronary arteries, including calcified atherosclerotic plaque in the left main, left anterior descending, left circumflex and right coronary arteries. Severe thickening calcification of the aortic valve. Severe calcifications of  the mitral annulus. Mediastinum/Lymph Nodes: No pathologically enlarged mediastinal or hilar lymph nodes. Small hiatal hernia. No axillary lymphadenopathy. Lungs/Pleura: Smoothly marginated 5 x 7 mm (mean diameter 6 mm) right lower lobe pulmonary nodule (axial image 55 of series 16). Mild scarring in the inferior segment of the left upper lobe. No acute consolidative airspace disease. No pleural effusions. Musculoskeletal/Soft Tissues: There are no aggressive appearing lytic or blastic lesions noted in the visualized portions of the skeleton. CTA ABDOMEN AND PELVIS FINDINGS Hepatobiliary: No suspicious cystic or solid hepatic lesions. No intra or extrahepatic biliary ductal dilatation. Gallbladder is normal in appearance. Pancreas: No pancreatic mass. No pancreatic ductal dilatation. No pancreatic or peripancreatic fluid or inflammatory changes. Spleen: Unremarkable. Adrenals/Urinary Tract: Low-attenuation lesions in both kidneys, compatible with simple cysts, largest of which is exophytic posteriorly in the interpolar region of the left kidney measuring 7.2 cm. Bilateral adrenal glands are normal in appearance. No hydroureteronephrosis. There are diverticulae associated with the urinary bladder bilaterally, largest of which is on the right side measuring 3.5 x 2.2 cm. Several areas of urothelial hyperenhancement, best appreciated on axial image 177 of series 15 in the posterior aspect of the urinary bladder, and in the posterior aspect of the left-sided urinary bladder diverticulum. Stomach/Bowel: Normal appearance of the stomach. No pathologic dilatation of small bowel or colon. Multiple colonic diverticulae are noted, without surrounding inflammatory changes to suggest an acute diverticulitis at this time. Status post right hemicolectomy. Vascular/Lymphatic: Aortic atherosclerosis, without evidence of aneurysm or dissection in the abdominal or pelvic vasculature. Vascular findings and measurements pertinent to  potential TAVR procedure, as detailed below. No lymphadenopathy noted in the abdomen or pelvis. Reproductive: Prostate gland and seminal vesicles are unremarkable in appearance. Other: 2 small ventral hernias are noted containing only omental fat. No significant volume of ascites. No pneumoperitoneum. Musculoskeletal: There are no aggressive appearing lytic or blastic lesions noted in the visualized portions of the skeleton. VASCULAR MEASUREMENTS PERTINENT TO TAVR: AORTA: Minimal Aortic Diameter-13 x 13 mm Severity of Aortic Calcification-severe RIGHT PELVIS: Right Common Iliac Artery - Minimal Diameter-7.4 x 5.9 mm Tortuosity-mild Calcification-severe Right External Iliac Artery - Minimal Diameter-8.9 x 4.9 mm Tortuosity - mild Calcification-moderate Right Common Femoral Artery - Minimal Diameter- no measurable patent lumen clearly identified (vessel is likely patent but nearly occluded) Tortuosity - mild Calcification-severe LEFT PELVIS: Left Common Iliac Artery - Minimal Diameter-9.1 x 6.1 mm  Tortuosity - mild Calcification-severe Left External Iliac Artery - Minimal Diameter-7.5 x 7.8 mm Tortuosity - mild Calcification-moderate Left Common Femoral Artery - Minimal Diameter-2.1 x 6.7 mm Tortuosity - mild Calcification-severe Review of the MIP images confirms the above findings. IMPRESSION: 1. Vascular findings and measurements pertinent to potential TAVR procedure, as detailed above. 2. Severe thickening calcification of the aortic valve, compatible with the reported clinical history of severe aortic stenosis. 3. Two small areas of urothelial hyperenhancement associated with the urinary bladder and a left-sided bladder diverticulum. This is nonspecific, but further clinical evaluation is recommended to exclude the possibility of urothelial neoplasm. 4. Smoothly marginated right lower lobe pulmonary nodule with a mean diameter of 6 mm. Non-contrast chest CT at 6-12 months is recommended. If the nodule is stable at  time of repeat CT, then future CT at 18-24 months (from today's scan) is considered optional for low-risk patients, but is recommended for high-risk patients. This recommendation follows the consensus statement: Guidelines for Management of Incidental Pulmonary Nodules Detected on CT Images: From the Fleischner Society 2017; Radiology 2017; 284:228-243. 5. Aortic atherosclerosis, in addition to left main and 3 vessel coronary artery disease. 6. Two small ventral hernias containing only omental fat. No associated bowel incarceration or obstruction at this time 7. Additional incidental findings, as above. Electronically Signed   By: Vinnie Langton M.D.   On: 10/26/2018 11:38   Vas US Carotid  Result Date: 10/26/2018 Carotid Arterial Duplex Study Indications:   Pre TAVR. Other Factors: Severe AS. Performing Technologist: Toma Copier RVS  Examination Guidelines: A complete evaluation includes B-mode imaging, spectral Doppler, color Doppler, and power Doppler as needed of all accessible portions of each vessel. Bilateral testing is considered an integral part of a complete examination. Limited examinations for reoccurring indications may be performed as noted.  Right Carotid Findings: +----------+--------+--------+--------+--------------------+-------------------+             PSV cm/s EDV cm/s Stenosis Describe             Comments             +----------+--------+--------+--------+--------------------+-------------------+  CCA Prox   106      10                                     mild intimal                                                                     thickening           +----------+--------+--------+--------+--------------------+-------------------+  CCA Distal 71       13                heterogenous         mild plaque          +----------+--------+--------+--------+--------------------+-------------------+  ICA Prox   56       8        1-39%    focal and            mild plaque with  heterogenous         acoustic shadowing   +----------+--------+--------+--------+--------------------+-------------------+  ICA Mid    63       14                                     mild intimal wall                                                                changes              +----------+--------+--------+--------+--------------------+-------------------+  ICA Distal 68       18                                     tortuous             +----------+--------+--------+--------+--------------------+-------------------+  ECA        170      4                 heterogenous         moderate plaque at                                                               the origin with                                                                  acoustic shadowing   +----------+--------+--------+--------+--------------------+-------------------+ +----------+--------+-------+--------+-------------------+             PSV cm/s EDV cms Describe Arm Pressure (mmHG)  +----------+--------+-------+--------+-------------------+  Subclavian 90                                             +----------+--------+-------+--------+-------------------+ +---------+--------+--+--------+--+  Vertebral PSV cm/s 42 EDV cm/s 14  +---------+--------+--+--------+--+  Left Carotid Findings: +----------+--------+--------+--------+------------+---------------------------+             PSV cm/s EDV cm/s Stenosis Describe     Comments                     +----------+--------+--------+--------+------------+---------------------------+  CCA Prox   93       11                             mild intimal wall changes    +----------+--------+--------+--------+------------+---------------------------+  CCA Distal 65       9  mild intimal wall changes    +----------+--------+--------+--------+------------+---------------------------+  ICA Prox   94       12       1-39%    heterogenous mild plaque at the  origin    +----------+--------+--------+--------+------------+---------------------------+  ICA Mid    69       11                             tortuous                     +----------+--------+--------+--------+------------+---------------------------+  ICA Distal 91       23                             tortuous                     +----------+--------+--------+--------+------------+---------------------------+  ECA        170      17                heterogenous moderate plaque with                                                             acoustic shadowing           +----------+--------+--------+--------+------------+---------------------------+ +----------+--------+--------+--------+-------------------+  Subclavian PSV cm/s EDV cm/s Describe Arm Pressure (mmHG)  +----------+--------+--------+--------+-------------------+             107                                             +----------+--------+--------+--------+-------------------+ +---------+--------+--+--------+-+  Vertebral PSV cm/s 30 EDV cm/s 7  +---------+--------+--+--------+-+  Summary: Right Carotid: Velocities in the right ICA are consistent with a 1-39% stenosis. Left Carotid: Velocities in the left ICA are consistent with a 1-39% stenosis. Vertebrals:  Bilateral vertebral arteries demonstrate antegrade flow. Subclavians: Normal flow hemodynamics were seen in bilateral subclavian              arteries. *See table(s) above for measurements and observations.  Electronically signed by Ruta Hinds MD on 10/26/2018 at 8:39:00 PM.    Final    Hybrid Or Imaging (mc Only)  Result Date: 11/17/2018 There is no interpretation for this exam.  This order is for images obtained during a surgical procedure.  Please See "Surgeries" Tab for more information regarding the procedure.   Ct Angio Abd/pel W/ And/or W/o  Result Date: 10/26/2018 CLINICAL DATA:  85 year old male with history of severe aortic stenosis. Preprocedural study prior to potential  transcatheter aortic valve replacement (TAVR) procedure. EXAM: CT ANGIOGRAPHY CHEST, ABDOMEN AND PELVIS TECHNIQUE: Multidetector CT imaging through the chest, abdomen and pelvis was performed using the standard protocol during bolus administration of intravenous contrast. Multiplanar reconstructed images and MIPs were obtained and reviewed to evaluate the vascular anatomy. CONTRAST:  170mL OMNIPAQUE IOHEXOL 350 MG/ML SOLN COMPARISON:  None. FINDINGS: CTA CHEST FINDINGS Cardiovascular: Heart size is normal. There is no significant pericardial fluid, thickening or pericardial calcification. There is aortic atherosclerosis, as well as atherosclerosis of the great vessels  of the mediastinum and the coronary arteries, including calcified atherosclerotic plaque in the left main, left anterior descending, left circumflex and right coronary arteries. Severe thickening calcification of the aortic valve. Severe calcifications of the mitral annulus. Mediastinum/Lymph Nodes: No pathologically enlarged mediastinal or hilar lymph nodes. Small hiatal hernia. No axillary lymphadenopathy. Lungs/Pleura: Smoothly marginated 5 x 7 mm (mean diameter 6 mm) right lower lobe pulmonary nodule (axial image 55 of series 16). Mild scarring in the inferior segment of the left upper lobe. No acute consolidative airspace disease. No pleural effusions. Musculoskeletal/Soft Tissues: There are no aggressive appearing lytic or blastic lesions noted in the visualized portions of the skeleton. CTA ABDOMEN AND PELVIS FINDINGS Hepatobiliary: No suspicious cystic or solid hepatic lesions. No intra or extrahepatic biliary ductal dilatation. Gallbladder is normal in appearance. Pancreas: No pancreatic mass. No pancreatic ductal dilatation. No pancreatic or peripancreatic fluid or inflammatory changes. Spleen: Unremarkable. Adrenals/Urinary Tract: Low-attenuation lesions in both kidneys, compatible with simple cysts, largest of which is exophytic posteriorly  in the interpolar region of the left kidney measuring 7.2 cm. Bilateral adrenal glands are normal in appearance. No hydroureteronephrosis. There are diverticulae associated with the urinary bladder bilaterally, largest of which is on the right side measuring 3.5 x 2.2 cm. Several areas of urothelial hyperenhancement, best appreciated on axial image 177 of series 15 in the posterior aspect of the urinary bladder, and in the posterior aspect of the left-sided urinary bladder diverticulum. Stomach/Bowel: Normal appearance of the stomach. No pathologic dilatation of small bowel or colon. Multiple colonic diverticulae are noted, without surrounding inflammatory changes to suggest an acute diverticulitis at this time. Status post right hemicolectomy. Vascular/Lymphatic: Aortic atherosclerosis, without evidence of aneurysm or dissection in the abdominal or pelvic vasculature. Vascular findings and measurements pertinent to potential TAVR procedure, as detailed below. No lymphadenopathy noted in the abdomen or pelvis. Reproductive: Prostate gland and seminal vesicles are unremarkable in appearance. Other: 2 small ventral hernias are noted containing only omental fat. No significant volume of ascites. No pneumoperitoneum. Musculoskeletal: There are no aggressive appearing lytic or blastic lesions noted in the visualized portions of the skeleton. VASCULAR MEASUREMENTS PERTINENT TO TAVR: AORTA: Minimal Aortic Diameter-13 x 13 mm Severity of Aortic Calcification-severe RIGHT PELVIS: Right Common Iliac Artery - Minimal Diameter-7.4 x 5.9 mm Tortuosity-mild Calcification-severe Right External Iliac Artery - Minimal Diameter-8.9 x 4.9 mm Tortuosity - mild Calcification-moderate Right Common Femoral Artery - Minimal Diameter- no measurable patent lumen clearly identified (vessel is likely patent but nearly occluded) Tortuosity - mild Calcification-severe LEFT PELVIS: Left Common Iliac Artery - Minimal Diameter-9.1 x 6.1 mm  Tortuosity - mild Calcification-severe Left External Iliac Artery - Minimal Diameter-7.5 x 7.8 mm Tortuosity - mild Calcification-moderate Left Common Femoral Artery - Minimal Diameter-2.1 x 6.7 mm Tortuosity - mild Calcification-severe Review of the MIP images confirms the above findings. IMPRESSION: 1. Vascular findings and measurements pertinent to potential TAVR procedure, as detailed above. 2. Severe thickening calcification of the aortic valve, compatible with the reported clinical history of severe aortic stenosis. 3. Two small areas of urothelial hyperenhancement associated with the urinary bladder and a left-sided bladder diverticulum. This is nonspecific, but further clinical evaluation is recommended to exclude the possibility of urothelial neoplasm. 4. Smoothly marginated right lower lobe pulmonary nodule with a mean diameter of 6 mm. Non-contrast chest CT at 6-12 months is recommended. If the nodule is stable at time of repeat CT, then future CT at 18-24 months (from today's scan) is considered optional for low-risk patients,  but is recommended for high-risk patients. This recommendation follows the consensus statement: Guidelines for Management of Incidental Pulmonary Nodules Detected on CT Images: From the Fleischner Society 2017; Radiology 2017; 284:228-243. 5. Aortic atherosclerosis, in addition to left main and 3 vessel coronary artery disease. 6. Two small ventral hernias containing only omental fat. No associated bowel incarceration or obstruction at this time 7. Additional incidental findings, as above. Electronically Signed   By: Vinnie Langton M.D.   On: 10/26/2018 11:38   Disposition   Pt is being discharged home today in good condition.  Follow-up Plans & Appointments    Follow-up Information    Eileen Stanford, PA-C. Go on 11/25/2018.   Specialties: Cardiology, Radiology Why: @ 3:30pm, please arrive at least 15 minutes early Contact information: [place] [address] [phone]            Discharge Medications   Allergies as of 11/18/2018      Reactions   Codeine Phosphate [codeine] Shortness Of Breath   Sulfa Antibiotics Shortness Of Breath   Asa [aspirin] Other (See Comments)   MILD ANEMIA      Medication List    STOP taking these medications   omeprazole 20 MG capsule Commonly known as: PRILOSEC Replaced by: pantoprazole 40 MG tablet     TAKE these medications   ALPRAZolam 0.25 MG tablet Commonly known as: XANAX Take 0.25 mg by mouth at bedtime.   amLODipine 5 MG tablet Commonly known as: NORVASC Take 5 mg by mouth 2 (two) times a day.   amoxicillin 500 MG tablet Commonly known as: AMOXIL Take 4 tablets (2,000 mg total) by mouth as directed. Take 1 hour prior to dental work, including routine cleanings.   aspirin 81 MG EC tablet Take 1 tablet (81 mg total) by mouth daily. Start taking on: November 19, 2018   clopidogrel 75 MG tablet Commonly known as: PLAVIX Take 1 tablet (75 mg total) by mouth daily with breakfast. Start taking on: November 19, 2018   docusate sodium 100 MG capsule Commonly known as: COLACE Take 100 mg by mouth daily.   finasteride 5 MG tablet Commonly known as: PROSCAR Take 5 mg by mouth daily.   Fish Oil 1000 MG Caps Take 1,000 mg by mouth daily.   FUSION PLUS PO Take 1 capsule by mouth daily.   losartan 50 MG tablet Commonly known as: COZAAR Take 50 mg by mouth daily.   multivitamin tablet Take 1 tablet by mouth daily.   pantoprazole 40 MG tablet Commonly known as: PROTONIX Take 1 tablet (40 mg total) by mouth daily. Start taking on: November 19, 2018 Replaces: omeprazole 20 MG capsule   psyllium 58.6 % packet Commonly known as: METAMUCIL Take 1 packet by mouth daily.   simvastatin 20 MG tablet Commonly known as: ZOCOR Take 20 mg by mouth at bedtime.           Outstanding Labs/Studies   none  Duration of Discharge Encounter   Greater than 30  minutes including physician time.  SignedAngelena Form, PA-C 11/18/2018, 11:01 AM [phone]

## 2018-11-18 NOTE — Progress Notes (Signed)
CARDIAC REHAB PHASE I   PRE:  Rate/Rhythm: 80 Afib  BP:  Sitting: 143/77      SaO2: 96 RA  MODE:  Ambulation: 350 ft   POST:  Rate/Rhythm: 90 Afib  BP:  Sitting: 162/70    SaO2: 97 RA  Pt ambulated 36ft in hallway assist of one with gait belt and front wheel walker. Pt c/o som SOB, but states it is due to wearing a mask. Pt educated on site care, and restrictions. Encouraged ambulation as able with emphasis on safety. Pt declining CRP II at this time. Waiting on echo, hopeful for d/c.  [id number] Rufina Falco, RN BSN 11/18/2018 10:20 AM

## 2018-11-18 NOTE — Plan of Care (Signed)
  Problem: Pain Managment: Goal: General experience of comfort will improve Outcome: Progressing   

## 2018-11-19 ENCOUNTER — Telehealth: Payer: Self-pay

## 2018-11-19 ENCOUNTER — Encounter (HOSPITAL_COMMUNITY): Payer: Self-pay | Admitting: Cardiovascular Disease

## 2018-11-19 NOTE — Telephone Encounter (Signed)
  [place] VALVE TEAM   Patient contacted regarding discharge from [facility] on 11/18/2018.  Patient understands to follow up with provider Nell Range PA-C on 11/25/2018 at 3:30 PM at [facility] location. Patient understands discharge instructions? yes Patient understands medications and regiment? yes Patient understands to bring all medications to this visit? yes   Postop Surgical Patients:                What is your wound status? Pt's son denies any signs/ symptoms of infection (Temp, redness/ red streaks, swelling, purulent drainage, foul odor or smell). .             Please do not place any creams/ lotions/ or antibiotic ointment on any surgical incisions/ wounds without physician approval. .            Please note that it is ok to remove your surgical dressing, shower (soap/ water), and pat the incision dry.  The pt does c/o soreness at Left Subclavian surgical site and I advised that he can take Tylenol for discomfort.  The pt was advised to follow the directions on his bottle at home.  The pt also still has dressings over his groin sites and I advised that these can be removed today.  Pt and son are very appreciative of the care that they have received.

## 2018-11-24 NOTE — Progress Notes (Signed)
[facility] [place]                                       Cardiology Office Note    Date:  11/25/2018   ID:  Stephen Wolf, DOB 06-Oct-1925, MRN [id number]  PCP:  Stephen Ginger, PA-C  Cardiologist: Dr. Claudie Wolf / Dr. Burt Wolf & Dr. Roxy Wolf (TAVR)  CC: [facility] s/p TAVR   History of Present Illness:  Stephen Wolf is a 85 y.o. male with a history of  HTN, HLD, PAD, iron deficiency anemia, multivessel CAD, rheumatic fever during childhood and severe AS s/p TAVR (11/14/18) who presents to clinic for follow up.   The patient has been followed by Stephen Cash PA-C for aortic stenosis. He was initially referred for surgical consultation about 1 year ago. At that time, he denies any symptoms and it was elected to follow the patient carefully. Over the past year the patient has developed worsening fatigue, generalized weakness, exertional shortness of breath, loss of appetite, weight loss, and occasional dizzy spells. Follow-up echocardiogram performed at [facility] Aug 31, 2018 revealed significant progression and severity of aortic stenosis with peak velocity across the aortic valve measured 3.9 m/s corresponding to mean transvalvular gradient estimated 38 mmHg. Cath 6/26 showed significant CAD including 100% chronic occlusion of the right coronary artery with 50-60% stenosis of the left main coronary artery. Plan was to treat CAD medically.   He underwent successful TAVR with a 26 mm Edwards Sapien Ultra THV via the left subclavian approach on 11/17/18. Post operative echo showed EF 65%, normally functioning TAVR with mean gradient of 8 mm Hg and no PVL. He was discharged on aspirin and plavix.   Today he presents to clinic for follow up. Here with son, Stephen Wolf. He is doing okay. No CP. He still does have some dyspnea on exertion with walking. No LE edema, orthopnea or PND. No dizziness or syncope. No blood in stool or urine. No palpitations. He does  complain of significant fatigue. He says he was hopeful that TAVR would help his fatigue and it still persists. He used to walk 1-2 hours through the mall in [place]. However, orthopedic issues and Covid now prohibits this. He has been walking 2-3 minutes up and down the driveway several times per day and does get some mild dyspnea with this.    Past Medical History:  Diagnosis Date  . Anemia    iron defiency  . Anxiety   . Coronary artery disease involving left main coronary artery 10/16/2018  . Hearing loss    bilateral - no hearing aids  . History of colon cancer   . Hyperlipidemia    MIXED  . Hypertension   . Osteopenia   . PAD (peripheral artery disease) ([place])   . Pulmonary nodule   . 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 Severe aortic stenosis   . Thoracic aortic ectasia ([place])   . Uses walker   . Vitamin D deficiency     Past Surgical History:  Procedure Laterality Date  . CARDIAC CATHETERIZATION    . COLONOSCOPY  08/2009   SCREENING..HYPLASTIC POLYPECTOMY..DR. DARRELUS  . INTRAVASCULAR PRESSURE WIRE/FFR STUDY N/A 10/16/2018   Procedure: INTRAVASCULAR PRESSURE WIRE/FFR STUDY;  Surgeon: Stephen Mocha, MD;  Location: [facility];  Service: Cardiovascular;  Laterality: N/A;  . RIGHT/LEFT HEART CATH AND CORONARY ANGIOGRAPHY N/A 10/16/2018   Procedure: RIGHT/LEFT HEART CATH AND CORONARY ANGIOGRAPHY;  Surgeon: Stephen Mocha, MD;  Location: [facility];  Service: Cardiovascular;  Laterality: N/A;  . TEE WITHOUT CARDIOVERSION N/A 11/17/2018   Procedure: TRANSESOPHAGEAL ECHOCARDIOGRAM (TEE);  Surgeon: Stephen Mocha, MD;  Location: [facility];  Service: Open Heart Surgery;  Laterality: N/A;    Current Medications: Outpatient Medications Prior to Visit  Medication Sig Dispense Refill  . ALPRAZolam (XANAX) 0.25 MG tablet Take 0.25 mg by mouth at bedtime.    [name]  amLODipine (NORVASC) 5 MG tablet Take 5 mg by mouth 2 (two) times a day.     [name] amoxicillin (AMOXIL) 500 MG tablet Take 4 tablets (2,000 mg total) by mouth as directed. Take 1 hour prior to dental work, including routine cleanings. 12 tablet 12  . aspirin EC 81 MG EC tablet Take 1 tablet (81 mg total) by mouth daily. 90 tablet 1  . clopidogrel (PLAVIX) 75 MG tablet Take 1 tablet (75 mg total) by mouth daily with breakfast. 90 tablet 1  . docusate sodium (COLACE) 100 MG capsule Take 100 mg by mouth daily.    . finasteride (PROSCAR) 5 MG tablet Take 5 mg by mouth daily.    . Iron-FA-B Cmp-C-Biot-Probiotic (FUSION PLUS PO) Take 1 capsule by mouth daily.    [name] losartan (COZAAR) 50 MG tablet Take 50 mg by mouth daily.    . Multiple Vitamin (MULTIVITAMIN) tablet Take 1 tablet by mouth daily.    . Omega-3 Fatty Acids (FISH OIL) 1000 MG CAPS Take 1,000 mg by mouth daily.     . pantoprazole (PROTONIX) 40 MG tablet Take 1 tablet (40 mg total) by mouth daily. 90 tablet 1  . psyllium (METAMUCIL) 58.6 % packet Take 1 packet by mouth daily.    . simvastatin (ZOCOR) 20 MG tablet Take 20 mg by mouth at bedtime.      No facility-administered medications prior to visit.      Allergies:   Codeine phosphate [codeine], Sulfa antibiotics, and Asa [aspirin]   Social History   Socioeconomic History  . Marital status: Widowed    Spouse name: Not on file  . Number of children: 1  . Years of education: Not on file  . Highest education level: Not on file  Occupational History  . Occupation: retired    Comment: Hydrographic surveyor  Social Needs  . Financial resource strain: Not on file  . Food insecurity    Worry: Not on file    Inability: Not on file  . Transportation needs    Medical: Not on file    Non-medical: Not on file  Tobacco Use  . Smoking status: Never Smoker  . Smokeless tobacco: Never Used  Substance and Sexual Activity  . Alcohol use: No  . Drug use: No  . Sexual activity: Never   Lifestyle  . Physical activity    Days per week: Not on file    Minutes per session: Not on file  . Stress: Not on file  Relationships  . Social Herbalist on phone: Not on file    Gets together: Not on file    Attends religious service: Not on file    Active member of club or organization: Not on file    Attends meetings of clubs or organizations: Not on file    Relationship status: Not  on file  Other Topics Concern  . Not on file  Social History Narrative   Lives with is son     Family History:  The patient's family history includes Heart disease in his brother and sister; Hyperlipidemia in his brother and sister; Hypertension in his brother and sister.      ROS:   Please see the history of present illness.    ROS All other systems reviewed and are negative.   PHYSICAL EXAM:   VS:  BP 128/86   Pulse 81   Ht 5' 8.5" (1.74 m)   Wt 142 lb 6.4 oz (64.6 kg)   SpO2 99%   BMI 21.34 kg/m    GEN: Well nourished, well developed, in no acute distress HEENT: normal Neck: no JVD or masses Cardiac: RRR; no murmurs, rubs, or gallops,no edema  Respiratory:  clear to auscultation bilaterally, normal work of breathing GI: soft, nontender, nondistended, + BS MS: no deformity or atrophy Skin: warm and dry, no rash Neuro:  Alert and Oriented x 3, Strength and sensation are intact Psych: euthymic mood, full affect   Wt Readings from Last 3 Encounters:  11/25/18 142 lb 6.4 oz (64.6 kg)  11/18/18 138 lb 9.6 oz (62.9 kg)  11/13/18 142 lb 3.2 oz (64.5 kg)      Studies/Labs Reviewed:   EKG:  EKG is ordered today.  The ekg ordered today demonstrates sinus with 1st deg AV blcok, PVC, HR 81 bpm  Recent Labs: 11/13/2018: ALT 21; B Natriuretic Peptide 168.4 11/18/2018: BUN 11; Creatinine, Ser 0.91; Hemoglobin 11.9; Magnesium 1.9; Platelets 206; Potassium 4.3; Sodium 129   Lipid Panel No results found for: CHOL, TRIG, HDL, CHOLHDL, VLDL, LDLCALC, LDLDIRECT  Additional  studies/ records that were reviewed today include:  TAVR OPERATIVE NOTE   Date of Procedure:11/17/2018  Preoperative Diagnosis:Severe Aortic Stenosis   Postoperative Diagnosis:Same   Procedure:   Transcatheter Aortic Valve Replacement - Left Transaxillary Approach Edwards Sapien 3 UltraTHV (size 37mm, model # [id number], serial # [id number])  Co-Surgeons:Clarence H. Stephen Manns, MD and Stephen Mocha, MD  Anesthesiologist:Ryan Roanna Banning, MD  Echocardiographer:Peter Johnsie Cancel, MD  Pre-operative Echo Findings: ? Severe aortic stenosis ? Normalleft ventricular systolic function  Post-operative Echo Findings: ? Noparavalvular leak ? Normalleft ventricular systolic function  _____________    11/18/18: IMPRESSIONS  1. The left ventricle has hyperdynamic systolic function, with an ejection fraction of >65%. The cavity size was normal. Left ventricular diastolic Doppler parameters are consistent with impaired relaxation.  2. The right ventricle has normal systolic function. The cavity was normal. There is no increase in right ventricular wall thickness. Right ventricular systolic pressure Cannot assess as IVC not well visualized.  3. Left atrial size was mildly dilated.  4. There is moderate mitral annular calcification present and trivial mitral regurgitation.  5. There is trivial tricuspid regurgitation.  6. The aorta is normal in size and structure.  7. - TAVR: 26 mm Sapien 3 valve that appears to be functioning normally. There is no perivalvular AI. AV Mean Grad:8.0 mmHg. AV Vmax:214.00 cm/s. AV Area (Vmax):2.04 cm. LVOT/AV VTI ratio:0.55.  8. Compared to prior echo there is now a TAVR.   ASSESSMENT & PLAN:   Severe AS s/p TAVR: doing okay. Groin sites are healing well. ECG with no HAVB. Continue Aspirin and plavix. Patient is a little disappointed that he doesn't  feel better after TAVR; he still has significant fatigue and dyspnea on exertion. I encouraged him to enroll in cardiac rehab. Per notes, he  declined this in the hospital so I will place order now.   HTN: BP well controlled today. Continue current regimen.  HLD: continue statin   CAD: he has significant, multivessel CAD. Continue medical therapy.   Chronic diastolic CHF: appears euvolemic off diuretics.   Fatigue: has still has a lot of fatigue. He was hopeful that this would improve with TAVR, but it hasn't. Will check TSH and CBC  Hyponatremia: this appears to be chronic by review of labs in our system and care everywhere. Slightly improved at 129 at discharge. Check bmet today  Incidental findings: noted on pre TAVR CT scan.  Two small areas of urothelial hyperenhancement associated with the urinary bladder and a left-sided bladder diverticulum. This is nonspecific, but further clinical evaluation is recommended to exclude the possibility of urothelial neoplasm. Discussed with Dr. Roxy Wolf who felt that urology consultation was not necessary.   Smoothly marginated right lower lobe pulmonary nodule with a mean diameter of 6 mm. Non-contrast chest CT at 6-12 months is recommended. If the nodule is stable at time of repeat CT, then future CT at 18-24 months (from today's scan) is considered optional for low-risk patients, but is recommended for high-risk patients. Patient is a never smoker. Patient is not interested in setting up a scan to follow this at this time.    Medication Adjustments/Labs and Tests Ordered: Current medicines are reviewed at length with the patient today.  Concerns regarding medicines are outlined above.  Medication changes, Labs and Tests ordered today are listed in the Patient Instructions below. Patient Instructions  Medication Instructions:  Your physician recommends that you continue on your current medications as directed. Please refer to the Current Medication  list given to you today.  If you need a refill on your cardiac medications before your next appointment, please call your pharmacy.   Lab work: BMET, CBC and TSH today  If you have labs (blood work) drawn today and your tests are completely normal, you will receive your results only by: [name] MyChart Message (if you have MyChart) OR . A paper copy in the mail If you have any lab test that is abnormal or we need to change your treatment, we will call you to review the results.  Testing/Procedures: None  Follow-Up: Keep current follow up appointments for echocardiogram and virtual visit with Nell Range, PA-C.  Any Other Special Instructions Will Be Listed Below (If Applicable).       Signed, Angelena Form, PA-C  11/25/2018 8:39 PM    [facility] [address], [place], [place]  [address] Phone: [phone]; Fax: [phone]

## 2018-11-25 ENCOUNTER — Encounter: Payer: Self-pay | Admitting: Physician Assistant

## 2018-11-25 ENCOUNTER — Other Ambulatory Visit: Payer: Self-pay

## 2018-11-25 ENCOUNTER — Ambulatory Visit (INDEPENDENT_AMBULATORY_CARE_PROVIDER_SITE_OTHER): Payer: Medicare Other | Admitting: Physician Assistant

## 2018-11-25 ENCOUNTER — Encounter (INDEPENDENT_AMBULATORY_CARE_PROVIDER_SITE_OTHER): Payer: Self-pay

## 2018-11-25 VITALS — BP 128/86 | HR 81 | Ht 68.5 in | Wt 142.4 lb

## 2018-11-25 DIAGNOSIS — I2510 Atherosclerotic heart disease of native coronary artery without angina pectoris: Secondary | ICD-10-CM | POA: Diagnosis not present

## 2018-11-25 DIAGNOSIS — E871 Hypo-osmolality and hyponatremia: Secondary | ICD-10-CM

## 2018-11-25 DIAGNOSIS — R5383 Other fatigue: Secondary | ICD-10-CM

## 2018-11-25 DIAGNOSIS — E785 Hyperlipidemia, unspecified: Secondary | ICD-10-CM | POA: Diagnosis not present

## 2018-11-25 DIAGNOSIS — Z952 Presence of prosthetic heart valve: Secondary | ICD-10-CM

## 2018-11-25 DIAGNOSIS — I10 Essential (primary) hypertension: Secondary | ICD-10-CM

## 2018-11-25 DIAGNOSIS — I5032 Chronic diastolic (congestive) heart failure: Secondary | ICD-10-CM

## 2018-11-25 DIAGNOSIS — R911 Solitary pulmonary nodule: Secondary | ICD-10-CM

## 2018-11-25 NOTE — Patient Instructions (Signed)
Medication Instructions:  Your physician recommends that you continue on your current medications as directed. Please refer to the Current Medication list given to you today.  If you need a refill on your cardiac medications before your next appointment, please call your pharmacy.   Lab work: BMET, CBC and TSH today  If you have labs (blood work) drawn today and your tests are completely normal, you will receive your results only by: [name] MyChart Message (if you have MyChart) OR . A paper copy in the mail If you have any lab test that is abnormal or we need to change your treatment, we will call you to review the results.  Testing/Procedures: None  Follow-Up: Keep current follow up appointments for echocardiogram and virtual visit with Nell Range, PA-C.  Any Other Special Instructions Will Be Listed Below (If Applicable).

## 2018-11-26 ENCOUNTER — Telehealth (HOSPITAL_COMMUNITY): Payer: Self-pay | Admitting: *Deleted

## 2018-11-26 ENCOUNTER — Telehealth: Payer: Self-pay | Admitting: Nurse Practitioner

## 2018-11-26 ENCOUNTER — Other Ambulatory Visit: Payer: Self-pay

## 2018-11-26 DIAGNOSIS — Z952 Presence of prosthetic heart valve: Secondary | ICD-10-CM

## 2018-11-26 DIAGNOSIS — N50819 Testicular pain, unspecified: Secondary | ICD-10-CM

## 2018-11-26 DIAGNOSIS — I350 Nonrheumatic aortic (valve) stenosis: Secondary | ICD-10-CM

## 2018-11-26 LAB — CBC
Hematocrit: 29.8 % — ABNORMAL LOW (ref 37.5–51.0)
Hemoglobin: 10.4 g/dL — ABNORMAL LOW (ref 13.0–17.7)
MCH: 34.3 pg — ABNORMAL HIGH (ref 26.6–33.0)
MCHC: 34.9 g/dL (ref 31.5–35.7)
MCV: 98 fL — ABNORMAL HIGH (ref 79–97)
Platelets: 289 10*3/uL (ref 150–450)
RBC: 3.03 x10E6/uL — ABNORMAL LOW (ref 4.14–5.80)
RDW: 12.7 % (ref 11.6–15.4)
WBC: 6.4 10*3/uL (ref 3.4–10.8)

## 2018-11-26 LAB — BASIC METABOLIC PANEL
BUN/Creatinine Ratio: 16 (ref 10–24)
BUN: 14 mg/dL (ref 10–36)
CO2: 22 mmol/L (ref 20–29)
Calcium: 9.4 mg/dL (ref 8.6–10.2)
Chloride: 90 mmol/L — ABNORMAL LOW (ref 96–106)
Creatinine, Ser: 0.9 mg/dL (ref 0.76–1.27)
GFR calc Af Amer: 86 mL/min/{1.73_m2} (ref >59)
GFR calc non Af Amer: 74 mL/min/{1.73_m2} (ref >59)
Glucose: 98 mg/dL (ref 65–99)
Potassium: 4.9 mmol/L (ref 3.5–5.2)
Sodium: 127 mmol/L — ABNORMAL LOW (ref 134–144)

## 2018-11-26 LAB — TSH: TSH: 2.53 u[IU]/mL (ref 0.450–4.500)

## 2018-11-26 NOTE — Telephone Encounter (Signed)
Received referral for this pt to participate in Cardiac Rehab from Dr. Burt Knack with the diagnosis of TAVR.  Pt seen inpatient by cardiac rehab phase I and declined to participate in outpatient cardiac rehab.  Called and spoke to pt and his son regarding Cardiac Rehab and his preference to participate her in [place] or [facility](pt lives in [place]). Pt would like to come to [facility]. Pt reluctant due to urology issue he is having and wanted to speak to [facility] again about the group exercise. Pt son concerned regarding his father advanced age.  Talked extensively to son to advise him of all the changes and requirements we have made within the department for optimal patient safety during Covid-19 pandemic.  Will send brochure to pt and contact information.  Pt will call when he feels ready to schedule. Cherre Huger, BSN Cardiac and Training and development officer

## 2018-11-26 NOTE — Telephone Encounter (Signed)
-----   Message from Loren Racer, RN sent at 11/26/2018  2:22 PM EDT ----- Spoke with son and went over results.  Pt then got on the phone and mentioned he would like to hold off on cardiac rehab for now because he is having pain in his testicles and since it was mentioned that he had an area in his bladder yesterday, he is afraid the pain will worsen doing the rehab.  He would like to possibly move forward with workup on the bladder findings.  I did mention reaching out to PCP and possibly being referred to Urology.  Pt asked that I send message to Nell Range, PA-C to see if referral can be sent or if she could call to discuss with him.  Advised I will send message and would call back with recommendations unless Joellen Jersey reaches out first.  Pt appreciative for call.

## 2018-11-26 NOTE — Telephone Encounter (Signed)
Referral to Dr. Alinda Money at [facility] placed

## 2018-11-27 ENCOUNTER — Inpatient Hospital Stay (HOSPITAL_COMMUNITY)
Admission: EM | Admit: 2018-11-27 | Discharge: 2018-11-30 | DRG: 312 | Disposition: A | Discharge status: Home / Self Care | Payer: No Typology Code available for payment source | Attending: Interventional Cardiology | Admitting: Interventional Cardiology

## 2018-11-27 ENCOUNTER — Encounter (HOSPITAL_COMMUNITY): Payer: Self-pay | Admitting: Emergency Medicine

## 2018-11-27 ENCOUNTER — Other Ambulatory Visit: Payer: Self-pay

## 2018-11-27 ENCOUNTER — Encounter: Payer: Self-pay | Admitting: Thoracic Surgery (Cardiothoracic Vascular Surgery)

## 2018-11-27 ENCOUNTER — Emergency Department (HOSPITAL_COMMUNITY): Payer: No Typology Code available for payment source

## 2018-11-27 ENCOUNTER — Telehealth: Payer: Self-pay | Admitting: Physician Assistant

## 2018-11-27 DIAGNOSIS — Z952 Presence of prosthetic heart valve: Secondary | ICD-10-CM

## 2018-11-27 DIAGNOSIS — I350 Nonrheumatic aortic (valve) stenosis: Secondary | ICD-10-CM | POA: Diagnosis present

## 2018-11-27 DIAGNOSIS — H9193 Unspecified hearing loss, bilateral: Secondary | ICD-10-CM | POA: Diagnosis present

## 2018-11-27 DIAGNOSIS — Z20828 Contact with and (suspected) exposure to other viral communicable diseases: Secondary | ICD-10-CM | POA: Diagnosis present

## 2018-11-27 DIAGNOSIS — E785 Hyperlipidemia, unspecified: Secondary | ICD-10-CM | POA: Diagnosis present

## 2018-11-27 DIAGNOSIS — I110 Hypertensive heart disease with heart failure: Secondary | ICD-10-CM | POA: Diagnosis present

## 2018-11-27 DIAGNOSIS — F419 Anxiety disorder, unspecified: Secondary | ICD-10-CM | POA: Diagnosis present

## 2018-11-27 DIAGNOSIS — I480 Paroxysmal atrial fibrillation: Secondary | ICD-10-CM | POA: Diagnosis not present

## 2018-11-27 DIAGNOSIS — I2510 Atherosclerotic heart disease of native coronary artery without angina pectoris: Secondary | ICD-10-CM | POA: Diagnosis present

## 2018-11-27 DIAGNOSIS — R51 Headache: Secondary | ICD-10-CM | POA: Diagnosis not present

## 2018-11-27 DIAGNOSIS — M8580 Other specified disorders of bone density and structure, unspecified site: Secondary | ICD-10-CM | POA: Diagnosis present

## 2018-11-27 DIAGNOSIS — Z79899 Other long term (current) drug therapy: Secondary | ICD-10-CM

## 2018-11-27 DIAGNOSIS — I4891 Unspecified atrial fibrillation: Secondary | ICD-10-CM | POA: Diagnosis present

## 2018-11-27 DIAGNOSIS — Z882 Allergy status to sulfonamides status: Secondary | ICD-10-CM

## 2018-11-27 DIAGNOSIS — E871 Hypo-osmolality and hyponatremia: Secondary | ICD-10-CM | POA: Diagnosis present

## 2018-11-27 DIAGNOSIS — N433 Hydrocele, unspecified: Secondary | ICD-10-CM | POA: Diagnosis present

## 2018-11-27 DIAGNOSIS — I951 Orthostatic hypotension: Secondary | ICD-10-CM | POA: Diagnosis not present

## 2018-11-27 DIAGNOSIS — Z885 Allergy status to narcotic agent status: Secondary | ICD-10-CM

## 2018-11-27 DIAGNOSIS — Z85038 Personal history of other malignant neoplasm of large intestine: Secondary | ICD-10-CM

## 2018-11-27 DIAGNOSIS — I5033 Acute on chronic diastolic (congestive) heart failure: Secondary | ICD-10-CM | POA: Diagnosis present

## 2018-11-27 DIAGNOSIS — Z7902 Long term (current) use of antithrombotics/antiplatelets: Secondary | ICD-10-CM

## 2018-11-27 DIAGNOSIS — Z86010 Personal history of colonic polyps: Secondary | ICD-10-CM

## 2018-11-27 DIAGNOSIS — Z7982 Long term (current) use of aspirin: Secondary | ICD-10-CM

## 2018-11-27 DIAGNOSIS — Z886 Allergy status to analgesic agent status: Secondary | ICD-10-CM

## 2018-11-27 DIAGNOSIS — N503 Cyst of epididymis: Secondary | ICD-10-CM | POA: Diagnosis present

## 2018-11-27 DIAGNOSIS — R55 Syncope and collapse: Secondary | ICD-10-CM | POA: Diagnosis not present

## 2018-11-27 DIAGNOSIS — Z953 Presence of xenogenic heart valve: Secondary | ICD-10-CM

## 2018-11-27 DIAGNOSIS — R911 Solitary pulmonary nodule: Secondary | ICD-10-CM | POA: Diagnosis present

## 2018-11-27 DIAGNOSIS — I4819 Other persistent atrial fibrillation: Secondary | ICD-10-CM | POA: Diagnosis present

## 2018-11-27 DIAGNOSIS — I739 Peripheral vascular disease, unspecified: Secondary | ICD-10-CM | POA: Diagnosis present

## 2018-11-27 DIAGNOSIS — Z8249 Family history of ischemic heart disease and other diseases of the circulatory system: Secondary | ICD-10-CM

## 2018-11-27 LAB — CBC WITH DIFFERENTIAL/PLATELET
Abs Immature Granulocytes: 0.07 10*3/uL (ref 0.00–0.07)
Basophils Absolute: 0 10*3/uL (ref 0.0–0.1)
Basophils Relative: 0 %
Eosinophils Absolute: 0 10*3/uL (ref 0.0–0.5)
Eosinophils Relative: 1 %
HCT: 28.2 % — ABNORMAL LOW (ref 39.0–52.0)
Hemoglobin: 9.6 g/dL — ABNORMAL LOW (ref 13.0–17.0)
Immature Granulocytes: 1 %
Lymphocytes Relative: 13 %
Lymphs Abs: 0.8 10*3/uL (ref 0.7–4.0)
MCH: 33.7 pg (ref 26.0–34.0)
MCHC: 34 g/dL (ref 30.0–36.0)
MCV: 98.9 fL (ref 80.0–100.0)
Monocytes Absolute: 0.5 10*3/uL (ref 0.1–1.0)
Monocytes Relative: 8 %
Neutro Abs: 4.7 10*3/uL (ref 1.7–7.7)
Neutrophils Relative %: 77 %
Platelets: 316 10*3/uL (ref 150–400)
RBC: 2.85 MIL/uL — ABNORMAL LOW (ref 4.22–5.81)
RDW: 12.5 % (ref 11.5–15.5)
WBC: 6.1 10*3/uL (ref 4.0–10.5)
nRBC: 0 % (ref 0.0–0.2)

## 2018-11-27 LAB — URINALYSIS, ROUTINE W REFLEX MICROSCOPIC
Bilirubin Urine: NEGATIVE
Glucose, UA: NEGATIVE mg/dL
Hgb urine dipstick: NEGATIVE
Ketones, ur: NEGATIVE mg/dL
Leukocytes,Ua: NEGATIVE
Nitrite: NEGATIVE
Protein, ur: NEGATIVE mg/dL
Specific Gravity, Urine: 1.008 (ref 1.005–1.030)
pH: 7 (ref 5.0–8.0)

## 2018-11-27 LAB — BASIC METABOLIC PANEL
Anion gap: 10 (ref 5–15)
BUN: 13 mg/dL (ref 8–23)
CO2: 25 mmol/L (ref 22–32)
Calcium: 8.8 mg/dL — ABNORMAL LOW (ref 8.9–10.3)
Chloride: 92 mmol/L — ABNORMAL LOW (ref 98–111)
Creatinine, Ser: 0.92 mg/dL (ref 0.61–1.24)
GFR calc Af Amer: >60 mL/min (ref >60)
GFR calc non Af Amer: >60 mL/min (ref >60)
Glucose, Bld: 119 mg/dL — ABNORMAL HIGH (ref 70–99)
Potassium: 4.4 mmol/L (ref 3.5–5.1)
Sodium: 127 mmol/L — ABNORMAL LOW (ref 135–145)

## 2018-11-27 MED ORDER — HEPARIN BOLUS VIA INFUSION
3300.0000 [IU] | Freq: Once | INTRAVENOUS | Status: AC
  Administered 2018-11-27: 3300 [IU] via INTRAVENOUS
  Filled 2018-11-27: qty 3300

## 2018-11-27 MED ORDER — HEPARIN (PORCINE) 25000 UT/250ML-% IV SOLN
1050.0000 [IU]/h | INTRAVENOUS | Status: DC
  Administered 2018-11-27 (×2): 950 [IU]/h via INTRAVENOUS
  Filled 2018-11-27 (×2): qty 250

## 2018-11-27 MED ORDER — PANTOPRAZOLE SODIUM 40 MG PO TBEC
40.0000 mg | DELAYED_RELEASE_TABLET | Freq: Every day | ORAL | Status: DC
  Administered 2018-11-28 – 2018-11-30 (×3): 40 mg via ORAL
  Filled 2018-11-27 (×3): qty 1

## 2018-11-27 MED ORDER — OMEGA-3-ACID ETHYL ESTERS 1 G PO CAPS
1.0000 g | ORAL_CAPSULE | Freq: Every day | ORAL | Status: DC
  Administered 2018-11-27 – 2018-11-30 (×4): 1 g via ORAL
  Filled 2018-11-27 (×5): qty 1

## 2018-11-27 MED ORDER — LOSARTAN POTASSIUM 50 MG PO TABS
50.0000 mg | ORAL_TABLET | Freq: Every day | ORAL | Status: DC
  Administered 2018-11-28: 50 mg via ORAL
  Filled 2018-11-27: qty 1

## 2018-11-27 MED ORDER — AMLODIPINE BESYLATE 5 MG PO TABS
5.0000 mg | ORAL_TABLET | Freq: Every day | ORAL | Status: DC
  Administered 2018-11-28: 5 mg via ORAL
  Filled 2018-11-27: qty 1

## 2018-11-27 MED ORDER — ACETAMINOPHEN 325 MG PO TABS
650.0000 mg | ORAL_TABLET | ORAL | Status: DC | PRN
  Administered 2018-11-28: 650 mg via ORAL
  Filled 2018-11-27: qty 2

## 2018-11-27 MED ORDER — FINASTERIDE 5 MG PO TABS
5.0000 mg | ORAL_TABLET | Freq: Every day | ORAL | Status: DC
  Administered 2018-11-27 – 2018-11-29 (×3): 5 mg via ORAL
  Filled 2018-11-27 (×4): qty 1

## 2018-11-27 MED ORDER — ATORVASTATIN CALCIUM 10 MG PO TABS
10.0000 mg | ORAL_TABLET | Freq: Every day | ORAL | Status: DC
  Administered 2018-11-27 – 2018-11-29 (×3): 10 mg via ORAL
  Filled 2018-11-27 (×3): qty 1

## 2018-11-27 MED ORDER — DOCUSATE SODIUM 100 MG PO CAPS
100.0000 mg | ORAL_CAPSULE | Freq: Two times a day (BID) | ORAL | Status: DC
  Administered 2018-11-28 – 2018-11-30 (×5): 100 mg via ORAL
  Filled 2018-11-27 (×5): qty 1

## 2018-11-27 MED ORDER — ONDANSETRON HCL 4 MG/2ML IJ SOLN: 4.0000 mg | Freq: Four times a day (QID) | INTRAMUSCULAR | Status: DC | PRN

## 2018-11-27 MED ORDER — ADULT MULTIVITAMIN W/MINERALS CH
1.0000 | ORAL_TABLET | Freq: Every day | ORAL | Status: DC
  Administered 2018-11-28: 1 via ORAL
  Filled 2018-11-27 (×4): qty 1

## 2018-11-27 NOTE — Progress Notes (Signed)
ANTICOAGULATION CONSULT NOTE - Initial Consult  Pharmacy Consult for heparin Indication: atrial fibrillation  Allergies  Allergen Reactions  . Codeine Phosphate [Codeine] Shortness Of Breath  . Sulfa Antibiotics Shortness Of Breath  . Asa [Aspirin] Other (See Comments)    MILD ANEMIA    Patient Measurements: Height: 5' 8.5" (174 cm) Weight: 145 lb (65.8 kg) IBW/kg (Calculated) : 69.55 Heparin Dosing Weight: 65.8 kg  Vital Signs: Temp: 98.6 F (37 C) (08/07 1215) Temp Source: Oral (08/07 1215) BP: 169/93 (08/07 1745) Pulse Rate: 76 (08/07 1745)  Labs: Recent Labs    11/25/18 1616 11/27/18 1241  HGB 10.4* 9.6*  HCT 29.8* 28.2*  PLT 289 316  CREATININE 0.90 0.92    Estimated Creatinine Clearance: 48.7 mL/min (by C-G formula based on SCr of 0.92 mg/dL).   Medical History: Past Medical History:  Diagnosis Date  . Anemia    iron defiency  . Anxiety   . Coronary artery disease involving left main coronary artery 10/16/2018  . Hearing loss    bilateral - no hearing aids  . History of colon cancer   . Hyperlipidemia    MIXED  . Hypertension   . Osteopenia   . PAD (peripheral artery disease) ([place])   . Pulmonary nodule   . 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 Severe aortic stenosis   . Thoracic aortic ectasia ([place])   . Uses walker   . Vitamin D deficiency     Medications:  Scheduled:  Infusions:   Assessment: 32 yoM admitted for syncope, found to have new-onset Afib. S/P TVAR on 7/28. Currently rate is controlled, with possibility of pacemaker placement based on findings. Not on anticoagulants PTA except for aspirin and Plavix, which are currently held inpatient while awaiting decision regarding pacemaker. Pharmacy consulted to dose heparin. Plans to transition to apixaban at discharge per H&P.  CBC low, Hgb 9.6, Plt wnl 316. No bleeding  noted.  Goal of Therapy:  Heparin level 0.3-0.7 units/ml Monitor platelets by anticoagulation protocol: Yes   Plan:  Heparin bolus 3300 units Heparin infusion at 950 units/hr Heparin level in 8 hours Monitor daily heparin levels, CBC, and s/sx of bleeding F/U plans for pacemaker and transition to Eliquis at discharge  Thank you for letting pharmacy be a part of this patient's care.  Berenice Bouton, PharmD PGY1 Pharmacy Resident Office phone: [phone]  11/27/2018,6:45 PM

## 2018-11-27 NOTE — H&P (Addendum)
Cardiology Admission History and Physical:   Patient ID: Stephen Wolf MRN: [id number]; DOB: 1925-06-10   Admission date: 11/27/2018  Primary Care Provider: Secundino Ginger, PA-C Primary Cardiologist: Dr. Claudie Leach TAVR: Dr. Burt Knack & Dr. Roxy Manns  Chief Complaint:  syncope   Patient Profile:   Stephen Wolf is a 85 y.o. male with hx of recent TAVR 11/17/18, HTN, PAD, HLD, IDA, multivessel CAD and rheumatic fever presented for syncope evaluation.   History of Present Illness:   Stephen Wolf has progression of aortic stenosis. echocardiogram performed at [facility] Aug 31, 2018 revealed significant progression and severity of aortic stenosis with peak velocity across the aortic valve measured 3.9 m/s corresponding to mean transvalvular gradient estimated 38 mmHg. Cath 10/16/18 showed significant CAD including 100% chronic occlusion of the right coronary artery with 50-60% stenosis of the left main coronary artery. Plan want to treat CAD medically.  He underwent TAVR on 11/17/18 with a42mm Edwards Sapien UltraTHV via theleft subclavianapproach on 11/17/18. Follow up echo showed normal valve function. He was doing well since discharge. Last night, he had cough and then had mild chest discomfort felt like indigestion. Resolved after breakfast. He was walking in driveway with son and suddenly felt "weak and swimmy headed" >> lean on car. Son gradually landed on drive. No injury. LOC for 30-60 seconds. No other prodromal syndromes. Came to ER for further evaluation. Found to have new onset afib.  Heart Pathway Score:     Past Medical History:  Diagnosis Date   Anemia    iron defiency   Anxiety    Coronary artery disease involving left main coronary artery 10/16/2018   Hearing loss    bilateral - no hearing aids   History of colon cancer    Hyperlipidemia    MIXED   Hypertension    Osteopenia    PAD (peripheral artery disease) ([facility])    Pulmonary nodule    S/P TAVR  (transcatheter aortic valve replacement) 11/17/2018   26 mm Edwards Sapien 3 transcatheter heart valve placed via left transaxillary approach    Severe aortic stenosis    Thoracic aortic ectasia ([facility])    Uses walker    Vitamin D deficiency     Past Surgical History:  Procedure Laterality Date   CARDIAC CATHETERIZATION     COLONOSCOPY  08/2009   SCREENING..HYPLASTIC POLYPECTOMY..DR. DARRELUS   INTRAVASCULAR PRESSURE WIRE/FFR STUDY N/A 10/16/2018   Procedure: INTRAVASCULAR PRESSURE WIRE/FFR STUDY;  Surgeon: Sherren Mocha, MD;  Location: [place];  Service: Cardiovascular;  Laterality: N/A;   RIGHT/LEFT HEART CATH AND CORONARY ANGIOGRAPHY N/A 10/16/2018   Procedure: RIGHT/LEFT HEART CATH AND CORONARY ANGIOGRAPHY;  Surgeon: Sherren Mocha, MD;  Location: [place] [facility];  Service: Cardiovascular;  Laterality: N/A;   TEE WITHOUT CARDIOVERSION N/A 11/17/2018   Procedure: TRANSESOPHAGEAL ECHOCARDIOGRAM (TEE);  Surgeon: Sherren Mocha, MD;  Location: [facility];  Service: Open Heart Surgery;  Laterality: N/A;     Medications Prior to Admission: Prior to Admission medications   Medication Sig Start Date End Date Taking? Authorizing Provider  ALPRAZolam (XANAX) 0.25 MG tablet Take 0.25 mg by mouth at bedtime. 10/01/18  Yes [provider]  amLODipine (NORVASC) 5 MG tablet Take 5 mg by mouth 2 (two) times a day.    Yes [provider]  aspirin EC 81 MG EC tablet Take 1 tablet (81 mg total) by mouth daily. 11/19/18  Yes Eileen Stanford, PA-C  clopidogrel (PLAVIX) 75 MG tablet Take  1 tablet (75 mg total) by mouth daily with breakfast. 11/19/18  Yes Eileen Stanford, PA-C  docusate sodium (COLACE) 100 MG capsule Take 100 mg by mouth 2 (two) times daily.    Yes [provider]  finasteride (PROSCAR) 5 MG tablet Take 5 mg by mouth at bedtime.    Yes [provider]  Iron-FA-B Cmp-C-Biot-Probiotic (FUSION PLUS PO) Take 1 capsule by mouth at  bedtime.    Yes [provider]  losartan (COZAAR) 50 MG tablet Take 50 mg by mouth daily.   Yes [provider]  Multiple Vitamin (MULTIVITAMIN) tablet Take 1 tablet by mouth at bedtime.    Yes [provider]  Omega-3 Fatty Acids (FISH OIL) 1000 MG CAPS Take 1,000 mg by mouth at bedtime.    Yes [provider]  pantoprazole (PROTONIX) 40 MG tablet Take 1 tablet (40 mg total) by mouth daily. 11/19/18  Yes Eileen Stanford, PA-C  simvastatin (ZOCOR) 20 MG tablet Take 20 mg by mouth at bedtime.    Yes [provider]  VITAMIN D, CHOLECALCIFEROL, PO Take 1 tablet by mouth at bedtime.   Yes [provider]  amoxicillin (AMOXIL) 500 MG tablet Take 4 tablets (2,000 mg total) by mouth as directed. Take 1 hour prior to dental work, including routine cleanings. 11/18/18   Eileen Stanford, PA-C     Allergies:    Allergies  Allergen Reactions   Codeine Phosphate [Codeine] Shortness Of Breath   Sulfa Antibiotics Shortness Of Breath   Asa [Aspirin] Other (See Comments)    MILD ANEMIA    Social History:   Social History   Socioeconomic History   Marital status: Widowed    Spouse name: Not on file   Number of children: 1   Years of education: Not on file   Highest education level: Not on file  Occupational History   Occupation: retired    Comment: Advertising account planner strain: Not on file   Food insecurity    Worry: Not on file    Inability: Not on file   Transportation needs    Medical: Not on file    Non-medical: Not on file  Tobacco Use   Smoking status: Never Smoker   Smokeless tobacco: Never Used  Substance and Sexual Activity   Alcohol use: No   Drug use: No   Sexual activity: Never  Lifestyle   Physical activity    Days per week: Not on file    Minutes per session: Not on file   Stress: Not on file  Relationships   Social connections    Talks on phone: Not on  file    Gets together: Not on file    Attends religious service: Not on file    Active member of club or organization: Not on file    Attends meetings of clubs or organizations: Not on file    Relationship status: Not on file   Intimate partner violence    Fear of current or ex partner: Not on file    Emotionally abused: Not on file    Physically abused: Not on file    Forced sexual activity: Not on file  Other Topics Concern   Not on file  Social History Narrative   Lives with is son    Family History:   The patient's family history includes Heart disease in his brother and sister; Hyperlipidemia in his brother and sister; Hypertension in his  brother and sister.    ROS:  Please see the history of present illness. All other ROS reviewed and negative.     Physical Exam/Data:   Vitals:   11/27/18 1545 11/27/18 1600 11/27/18 1615 11/27/18 1630  BP: (!) 151/94 (!) 160/86 (!) 172/82 (!) 162/88  Pulse: 76 77 79 75  Resp: 17 15 20 18   Temp:      TempSrc:      SpO2: 100% 100% 96% 100%  Weight:      Height:        Intake/Output Summary (Last 24 hours) at 11/27/2018 1801 Last data filed at 11/27/2018 1451 Gross per 24 hour  Intake --  Output 525 ml  Net -525 ml   Last 3 Weights 11/27/2018 11/25/2018 11/18/2018  Weight (lbs) 145 lb 142 lb 6.4 oz 138 lb 9.6 oz  Weight (kg) 65.772 kg 64.592 kg 62.869 kg     Body mass index is 21.73 kg/m.  General:  Well nourished, well developed, in no acute distress HEENT: normal Lymph: no adenopathy Neck: no JVD Endocrine:  No thryomegaly Vascular: No carotid bruits; FA pulses 2+ bilaterally without bruits  Cardiac:  normal S1, S2; Ir IR ; no murmur  Lungs:  clear to auscultation bilaterally, no wheezing, rhonchi or rales  Abd: soft, nontender, no hepatomegaly  Ext: no edema Musculoskeletal:  No deformities, BUE and BLE strength normal and equal Skin: warm and dry  Neuro:  CNs 2-12 intact, no focal abnormalities noted Psych:  Normal affect     EKG:  The ECG that was done today was personally reviewed and demonstrates atrial fibrillation with controlled rate  Relevant CV Studies:  INTRAVASCULAR PRESSURE WIRE/FFR STUDY 10/16/2018  RIGHT/LEFT HEART CATH AND CORONARY ANGIOGRAPHY  Conclusion  1. Multivessel CAD with ostial occlusion of the RCA, collateralized with L--->R collaterals, moderate stenosis of the left main, moderate stenosis of the proximal LCx, and nonobstructive stenosis of the LAD 2. Positive DFR of the left main/LCx = 0.84 (Positive less than 0.89) 3. Aortic stenosis with mean transvalvular gradient 17 mmhg, peak instantaneous gradient 28 mmHg  Plan: review films with Dr Roxy Manns to determine best treatment option for aortic stenosis and multivessel CAD, check 2D echo     Laboratory Data:  High Sensitivity Troponin:  No results for input(s): TROPONINIHS in the last 720 hours.    Cardiac EnzymesNo results for input(s): TROPONINI in the last 168 hours. No results for input(s): TROPIPOC in the last 168 hours.  Chemistry Recent Labs  Lab 11/25/18 1616 11/27/18 1241  NA 127* 127*  K 4.9 4.4  CL 90* 92*  CO2 22 25  GLUCOSE 98 119*  BUN 14 13  CREATININE 0.90 0.92  CALCIUM 9.4 8.8*  GFRNONAA 74 >60  GFRAA 86 >60  ANIONGAP  --  10    No results for input(s): PROT, ALBUMIN, AST, ALT, ALKPHOS, BILITOT in the last 168 hours. Hematology Recent Labs  Lab 11/25/18 1616 11/27/18 1241  WBC 6.4 6.1  RBC 3.03* 2.85*  HGB 10.4* 9.6*  HCT 29.8* 28.2*  MCV 98* 98.9  MCH 34.3* 33.7  MCHC 34.9 34.0  RDW 12.7 12.5  PLT 289 316   BNPNo results for input(s): BNP, PROBNP in the last 168 hours.  DDimer No results for input(s): DDIMER in the last 168 hours.   Radiology/Studies:  Ct Head Wo Contrast  Result Date: 11/27/2018 CLINICAL DATA:  Syncopal episode. EXAM: CT HEAD WITHOUT CONTRAST TECHNIQUE: Contiguous axial images were obtained from the base  of the skull through the vertex without intravenous contrast.  COMPARISON:  02/25/2017 FINDINGS: Brain: Age related atrophy. Chronic small-vessel ischemic changes of the cerebral hemispheric white matter. No sign of acute infarction, mass lesion, hemorrhage, hydrocephalus or extra-axial collection. Vascular: There is atherosclerotic calcification of the major vessels at the base of the brain. Skull: Negative Sinuses/Orbits: Clear/normal Other: None IMPRESSION: Atrophy and chronic small-vessel ischemic changes of the cerebral hemispheric white matter. No acute finding. Electronically Signed   By: Nelson Chimes M.D.   On: 11/27/2018 14:43   Dg Chest Port 1 View  Result Date: 11/27/2018 CLINICAL DATA:  Syncope EXAM: PORTABLE CHEST 1 VIEW COMPARISON:  Portable exam 1445 hours compared to 11/17/2018 FINDINGS: Normal heart size post TAVR. Mediastinal contours and pulmonary vascularity normal. Atherosclerotic calcification aorta. Lungs clear. No pulmonary infiltrate, pleural effusion or pneumothorax. Osseous demineralization with minimal scattered endplate spur formation. IMPRESSION: No acute abnormalities. Electronically Signed   By: Lavonia Dana M.D.   On: 11/27/2018 15:04   US Scrotum W/doppler  Result Date: 11/27/2018 CLINICAL DATA:  BILATERAL testicular pain for 1-2 years EXAM: SCROTAL ULTRASOUND DOPPLER ULTRASOUND OF THE TESTICLES TECHNIQUE: Complete ultrasound examination of the testicles, epididymis, and other scrotal structures was performed. Color and spectral Doppler ultrasound were also utilized to evaluate blood flow to the testicles. COMPARISON:  None FINDINGS: Right testicle Measurements: 3.7 x 2.0 x 2.8 cm. Normal echogenicity without mass or calcification. Internal blood flow present on color Doppler imaging. Left testicle Measurements: 3.0 x 2.5 x 1.5 cm. Normal echogenicity. Focus of tubular ectasia of the rete testis is identified. No additional testicular mass or calcification. Internal blood flow present on color Doppler imaging, symmetric with RIGHT. Right  epididymis:  Normal in size and appearance. Left epididymis:  Small cyst within LEFT epididymis 5 x 6 x 5 mm. Hydrocele:  Small BILATERAL hydroceles Varicocele:  None visualized. Pulsed Doppler interrogation of both testes demonstrates normal low resistance arterial and venous waveforms bilaterally. IMPRESSION: Small focus of tubular ectasia of the LEFT rete testis. Small LEFT epididymal cyst 6 mm greatest size. Otherwise normal exam. Electronically Signed   By: Lavonia Dana M.D.   On: 11/27/2018 14:42    Assessment and Plan:   1. Syncope -Likely due to conduction abnormality related to recent TAVR.  New finding of atrial fibrillation.  Rate controlled in 70s.  Intermittent pause of 1.8-second.  He might have a long postconversion pause or complete heart block.  Admit and monitor on telemetry.  Not on any rate control agent.  Eventually needs pacemaker.  2.  New onset atrial fibrillation -Rate is controlled.  Avoid any AV blocking agent. CHADSVASCs score of 5.  Reviewed with Dr. Angelena Form by Dr. Irish Lack.  Stop aspirin and Plavix.  Start IV heparin in case needs pacemaker based on findings.  Transition to Eliquis at discharge.  3.  Status post TAVR -Postoperative echo showed normal functioning valve. -Doing well -Stop aspirin and Plavix as above.  4.  CAD Chest pain episode last night does not concern for ACS.  Chest pain resolved after breakfast this morning.   5.  Hypertension -Blood pressure minimally elevated -Resume home medication  Severity of Illness: The appropriate patient status for this patient is OBSERVATION. Observation status is judged to be reasonable and necessary in order to provide the required intensity of service to ensure the patient's safety. The patient's presenting symptoms, physical exam findings, and initial radiographic and laboratory data in the context of their medical condition is felt to  place them at decreased risk for further clinical deterioration. Furthermore,  it is anticipated that the patient will be medically stable for discharge from the hospital within 2 midnights of admission. The following factors support the patient status of observation.   " The patient's presenting symptoms include  Syncope. " The physical exam findings include none. " The initial radiographic and laboratory data are  Afib     For questions or updates, please contact [place] Please consult [email or web address] for contact info under        SignedLeanor Kail, PA  11/27/2018 6:01 PM   I have examined the patient and reviewed assessment and plan and discussed with patient.  Agree with above as stated.    Concerning that syncope could have been related to a conduction abnormality, particularly given his age and recent TAVR.  Exam shows no signs of heart failure or valve problem.  No significant murmur on exam.   Will watch on telemetry.  Stop aspirin and Plavix.  Start IV heparin.  Will likely have to transition to Eliquis given the new onset atrial fibrillation.  Interestingly, his QRS complex is narrow.  The pre-TAVR ECG showed a slightly wider QRS complex.  On the telemetry in the emergency room, we have noted up to 1.8-second pauses.  Certainly, if he has more prolonged pauses, will have to consider pacemaker placement.    Larae Grooms

## 2018-11-27 NOTE — ED Provider Notes (Signed)
[facility] EMERGENCY DEPARTMENT Provider Note   CSN: [id number] Arrival date & time: 11/27/18  1208    History   Chief Complaint Chief Complaint  Patient presents with   Loss of Consciousness    HPI Stephen Wolf is a 85 y.o. male.     Patient is a 85 year old who presents with syncope.  He has a history of progressive aortic stenosis status post TAVR procedure on July 28.  He also has a history of hypertension, hyperlipidemia and coronary artery disease.  He states he is been feeling pretty well after the surgery.  He was trying to be active and was walking up and down the driveway.  When he started walking toward the end of the driveway, he became lightheaded and dizzy.  His son caught him and laid him on the ground.  He had a brief syncopal event lasting about 30 seconds.  There is no seizure type activity.  He denies any chest pain or shortness of breath.  He denies any recent vomiting.  No fevers.  No diarrhea.  He has intermittently a little bit of burning on urination.  He was recently treated for urinary infection with Cipro.  Incidentally, during his TAVR work-up he was noted to have some nonspecific spots on his bladder and a bladder diverticulum which will need outpatient follow-up by urology.  He also has been complaining of some testicular soreness for about 2 or 3 months per the son.     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   26 mm Edwards Sapien 3 transcatheter heart valve placed via left transaxillary approach    Severe aortic stenosis    Thoracic aortic ectasia (HCC)     Uses walker    Vitamin D deficiency     Patient Active Problem List   Diagnosis Date Noted   Hyponatremia 11/17/2018   Pulmonary nodule 11/17/2018   Acute on chronic diastolic heart failure ([place]) 11/17/2018   S/P TAVR (transcatheter aortic valve replacement) 11/17/2018   Severe aortic stenosis    Hypertension    Hyperlipidemia    History of colon cancer    Coronary artery disease involving left main coronary artery 10/16/2018   Osteopenia    Vitamin D deficiency    PAD (peripheral artery disease) (HCC)    Thoracic aortic ectasia (HCC)    Anemia     Past Surgical History:  Procedure Laterality Date   CARDIAC CATHETERIZATION     COLONOSCOPY  08/2009   SCREENING..HYPLASTIC POLYPECTOMY..DR. DARRELUS   INTRAVASCULAR PRESSURE WIRE/FFR STUDY N/A 10/16/2018   Procedure: INTRAVASCULAR PRESSURE WIRE/FFR STUDY;  Surgeon: Sherren Mocha, MD;  Location: [facility];  Service: Cardiovascular;  Laterality: N/A;   RIGHT/LEFT HEART CATH AND CORONARY ANGIOGRAPHY N/A 10/16/2018   Procedure: RIGHT/LEFT HEART CATH AND CORONARY ANGIOGRAPHY;  Surgeon: Sherren Mocha, MD;  Location: [facility];  Service: Cardiovascular;  Laterality: N/A;   TEE WITHOUT CARDIOVERSION N/A 11/17/2018   Procedure: TRANSESOPHAGEAL ECHOCARDIOGRAM (TEE);  Surgeon: Sherren Mocha, MD;  Location: [facility];  Service: Open Heart Surgery;  Laterality: N/A;        Home Medications    Prior to Admission medications   Medication Sig Start Date End Date Taking? Authorizing Provider  ALPRAZolam (XANAX) 0.25 MG tablet Take 0.25 mg by mouth at bedtime. 10/01/18  Yes [provider]  amLODipine (NORVASC) 5 MG tablet Take 5 mg by mouth 2 (two) times a day.    Yes [provider]  aspirin EC 81 MG EC tablet Take 1 tablet (81 mg total) by mouth daily. 11/19/18  Yes Eileen Stanford, PA-C  clopidogrel (PLAVIX) 75 MG tablet Take 1 tablet (75 mg total) by mouth daily with breakfast.  11/19/18  Yes Eileen Stanford, PA-C  docusate sodium (COLACE) 100 MG capsule Take 100 mg by mouth 2 (two) times daily.    Yes [provider]  finasteride (PROSCAR) 5 MG tablet Take 5 mg by mouth at bedtime.    Yes [provider]  Iron-FA-B Cmp-C-Biot-Probiotic (FUSION PLUS PO) Take 1 capsule by mouth at bedtime.    Yes [provider]  losartan (COZAAR) 50 MG tablet Take 50 mg by mouth daily.   Yes [provider]  Multiple Vitamin (MULTIVITAMIN) tablet Take 1 tablet by mouth at bedtime.    Yes [provider]  Omega-3 Fatty Acids (FISH OIL) 1000 MG CAPS Take 1,000 mg by mouth at bedtime.    Yes [provider]  pantoprazole (PROTONIX) 40 MG tablet Take 1 tablet (40 mg total) by mouth daily. 11/19/18  Yes Eileen Stanford, PA-C  simvastatin (ZOCOR) 20 MG tablet Take 20 mg by mouth at bedtime.    Yes [provider]  VITAMIN D, CHOLECALCIFEROL, PO Take 1 tablet by mouth at bedtime.   Yes [provider]  amoxicillin (AMOXIL) 500 MG tablet Take 4 tablets (2,000 mg total) by mouth as directed. Take 1 hour prior to dental work, including routine cleanings. 11/18/18   Eileen Stanford, PA-C    Family History Family History  Problem Relation Age of Onset   Hyperlipidemia Sister    Hypertension Sister    Heart disease Sister    Hyperlipidemia Brother    Hypertension Brother    Heart disease Brother     Social History Social History   Tobacco Use   Smoking status: Never Smoker   Smokeless tobacco: Never Used  Substance Use Topics   Alcohol use: No   Drug use: No     Allergies   Codeine phosphate [codeine], Sulfa antibiotics, and Asa [aspirin]   Review of Systems Review of Systems  Constitutional: Negative for chills, diaphoresis, fatigue and fever.  HENT: Negative for congestion, rhinorrhea and sneezing.   Eyes: Negative.   Respiratory: Negative for cough, chest tightness and shortness of  breath.   Cardiovascular: Negative for chest pain and leg swelling.  Gastrointestinal: Negative for abdominal pain, blood in stool, diarrhea, nausea and vomiting.  Genitourinary: Positive for testicular pain. Negative for difficulty urinating, flank pain, frequency and hematuria.  Musculoskeletal: Negative for arthralgias and back pain.  Skin: Negative for rash.  Neurological: Positive for syncope and light-headedness. Negative for dizziness, speech difficulty, weakness, numbness and headaches.     Physical Exam Updated Vital Signs BP (!) 162/88    Pulse 75    Temp 98.6 F (37 C) (Oral)    Resp 18    Ht 5' 8.5" (1.74 m)    Wt 65.8 kg    SpO2 100%    BMI  21.73 kg/m   Physical Exam Constitutional:      Appearance: He is well-developed.  HENT:     Head: Normocephalic and atraumatic.  Eyes:     Pupils: Pupils are equal, round, and reactive to light.  Neck:     Musculoskeletal: Normal range of motion and neck supple.  Cardiovascular:     Rate and Rhythm: Normal rate and regular rhythm.     Heart sounds: Normal heart sounds.     Comments: Patient has an healing incision to his right upper chest which looks like it is well-healing.  There is no drainage, swelling or signs of infection Pulmonary:     Effort: Pulmonary effort is normal. No respiratory distress.     Breath sounds: Normal breath sounds. No wheezing or rales.  Chest:     Chest wall: No tenderness.  Abdominal:     General: Bowel sounds are normal.     Palpations: Abdomen is soft.     Tenderness: There is no abdominal tenderness. There is no guarding or rebound.  Genitourinary:    Comments: There is some tenderness on palpation of both testicles.  There is no noticeable swelling.  No color change.  No pain in the inguinal canals.  No hernias palpated Musculoskeletal: Normal range of motion.  Lymphadenopathy:     Cervical: No cervical adenopathy.  Skin:    General: Skin is warm and dry.     Findings: No rash.    Neurological:     General: No focal deficit present.     Mental Status: He is alert and oriented to person, place, and time.     Comments: He is moving all extremities symmetrically without focal deficits, although exam is limited as patient is not following commands      ED Treatments / Results  Labs (all labs ordered are listed, but only abnormal results are displayed) Labs Reviewed  BASIC METABOLIC PANEL - Abnormal; Notable for the following components:      Result Value   Sodium 127 (*)    Chloride 92 (*)    Glucose, Bld 119 (*)    Calcium 8.8 (*)    All other components within normal limits  CBC WITH DIFFERENTIAL/PLATELET - Abnormal; Notable for the following components:   RBC 2.85 (*)    Hemoglobin 9.6 (*)    HCT 28.2 (*)    All other components within normal limits  URINALYSIS, ROUTINE W REFLEX MICROSCOPIC - Abnormal; Notable for the following components:   APPearance HAZY (*)    All other components within normal limits    EKG EKG Interpretation  Date/Time:  Friday November 27 2018 12:20:17 EDT Ventricular Rate:  72 PR Interval:    QRS Duration: 85 QT Interval:  407 QTC Calculation: 446 R Axis:   43 Text Interpretation:  Atrial fibrillation Anterior infarct, old Minimal ST depression, inferior leads Confirmed by Malvin Johns [phone]) on 11/27/2018 12:26:49 PM   Radiology Ct Head Wo Contrast  Result Date: 11/27/2018 CLINICAL DATA:  Syncopal episode. EXAM: CT HEAD WITHOUT CONTRAST TECHNIQUE: Contiguous axial images were obtained from the base of the skull through the vertex without intravenous contrast. COMPARISON:  02/25/2017 FINDINGS: Brain: Age related atrophy. Chronic small-vessel ischemic changes of the cerebral hemispheric white matter. No sign of acute infarction, mass lesion, hemorrhage, hydrocephalus or extra-axial collection. Vascular: There is atherosclerotic calcification of the major vessels at the base of the brain. Skull: Negative Sinuses/Orbits:  Clear/normal Other: None IMPRESSION: Atrophy and chronic small-vessel ischemic changes  of the cerebral hemispheric white matter. No acute finding. Electronically Signed   By: Nelson Chimes M.D.   On: 11/27/2018 14:43   Dg Chest Port 1 View  Result Date: 11/27/2018 CLINICAL DATA:  Syncope EXAM: PORTABLE CHEST 1 VIEW COMPARISON:  Portable exam 1445 hours compared to 11/17/2018 FINDINGS: Normal heart size post TAVR. Mediastinal contours and pulmonary vascularity normal. Atherosclerotic calcification aorta. Lungs clear. No pulmonary infiltrate, pleural effusion or pneumothorax. Osseous demineralization with minimal scattered endplate spur formation. IMPRESSION: No acute abnormalities. Electronically Signed   By: Lavonia Dana M.D.   On: 11/27/2018 15:04   US Scrotum W/doppler  Result Date: 11/27/2018 CLINICAL DATA:  BILATERAL testicular pain for 1-2 years EXAM: SCROTAL ULTRASOUND DOPPLER ULTRASOUND OF THE TESTICLES TECHNIQUE: Complete ultrasound examination of the testicles, epididymis, and other scrotal structures was performed. Color and spectral Doppler ultrasound were also utilized to evaluate blood flow to the testicles. COMPARISON:  None FINDINGS: Right testicle Measurements: 3.7 x 2.0 x 2.8 cm. Normal echogenicity without mass or calcification. Internal blood flow present on color Doppler imaging. Left testicle Measurements: 3.0 x 2.5 x 1.5 cm. Normal echogenicity. Focus of tubular ectasia of the rete testis is identified. No additional testicular mass or calcification. Internal blood flow present on color Doppler imaging, symmetric with RIGHT. Right epididymis:  Normal in size and appearance. Left epididymis:  Small cyst within LEFT epididymis 5 x 6 x 5 mm. Hydrocele:  Small BILATERAL hydroceles Varicocele:  None visualized. Pulsed Doppler interrogation of both testes demonstrates normal low resistance arterial and venous waveforms bilaterally. IMPRESSION: Small focus of tubular ectasia of the LEFT rete  testis. Small LEFT epididymal cyst 6 mm greatest size. Otherwise normal exam. Electronically Signed   By: Lavonia Dana M.D.   On: 11/27/2018 14:42    Procedures Procedures (including critical care time)  Medications Ordered in ED Medications - No data to display   Initial Impression / Assessment and Plan / ED Course  I have reviewed the triage vital signs and the nursing notes.  Pertinent labs & imaging results that were available during my care of the patient were reviewed by me and considered in my medical decision making (see chart for details).        Patient is a 85 year old male who presents with a syncopal episode after recent TAVR procedure.  He has no neurologic deficits.  No chest pain or shortness of breath.  His EKG does look concerning for atrial fibrillation although a controlled rate.  His head CT shows no acute abnormality.  He did have an ultrasound of his testicles given his testicular tenderness which shows some mild abnormalities but nothing that appears acute or is likely the etiology for his symptoms.  His urine does not appear to be infected.  He has some mild hyponatremia but otherwise his labs are non-concerning.  Cardiology has seen the patient and will admit for further evaluation.  Final Clinical Impressions(s) / ED Diagnoses   Final diagnoses:  Syncope and collapse    ED Discharge Orders    None       Malvin Johns, MD 11/27/18 1820

## 2018-11-27 NOTE — Telephone Encounter (Signed)
  [facility], the patient's son, called into the office to report that his father had not been feeling well this morning and passed out in the driveway. EMS was called and they are en route to [facility] for further evaluation. Per [name], his BP and ECG looked okay per EMS. Will follow along.   Angelena Form PA-C  [facility]

## 2018-11-27 NOTE — ED Notes (Signed)
Pt transported Ultrasound.

## 2018-11-27 NOTE — ED Notes (Signed)
ED TO INPATIENT HANDOFF REPORT  ED Nurse Name and Phone #:  [phone]  S Name/Age/Gender Stephen Wolf 85 y.o. male Room/Bed: 039C/039C  Code Status   Code Status: Prior  Home/SNF/Other Home Patient oriented to: self, place, time and situation Is this baseline? Yes   Triage Complete: Triage complete  Chief Complaint SYNCOPY  Triage Note Patient arrived via EMS. Dizziness and loss of consciousness while out walking with son. Recent heart valve replaced last week. Denies chest pain, shortness of breath. Alert and oriented. Complains of slight headache.    Allergies Allergies  Allergen Reactions  . Codeine Phosphate [Codeine] Shortness Of Breath  . Sulfa Antibiotics Shortness Of Breath  . Asa [Aspirin] Other (See Comments)    MILD ANEMIA    Level of Care/Admitting Diagnosis ED Disposition    ED Disposition Condition Comment   Admit  Hospital Area: [facility] [[id number]]  Level of Care: Telemetry Cardiac [103]  Covid Evaluation: Person Under Investigation (PUI)  Diagnosis: Atrial fibrillation ([place]) [427.31.ICD-9-CM]  Admitting Physician: Jettie Booze [[id number]]  Attending Physician: Jettie Booze [[id number]]  PT Class (Do Not Modify): Observation [104]  PT Acc Code (Do Not Modify): Observation [10022]       B Medical/Surgery History Past Medical History:  Diagnosis Date  . Anemia    iron defiency  . Anxiety   . Coronary artery disease involving left main coronary artery 10/16/2018  . Hearing loss    bilateral - no hearing aids  . History of colon cancer   . Hyperlipidemia    MIXED  . Hypertension   . Osteopenia   . PAD (peripheral artery disease) ([place])   . Pulmonary nodule   . 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 Severe aortic stenosis   . Thoracic aortic ectasia ([place])   . Uses walker   . Vitamin D deficiency     Past Surgical History:  Procedure Laterality Date  . CARDIAC CATHETERIZATION    . COLONOSCOPY  08/2009   SCREENING..HYPLASTIC POLYPECTOMY..DR. DARRELUS  . INTRAVASCULAR PRESSURE WIRE/FFR STUDY N/A 10/16/2018   Procedure: INTRAVASCULAR PRESSURE WIRE/FFR STUDY;  Surgeon: Sherren Mocha, MD;  Location: [facility];  Service: Cardiovascular;  Laterality: N/A;  . RIGHT/LEFT HEART CATH AND CORONARY ANGIOGRAPHY N/A 10/16/2018   Procedure: RIGHT/LEFT HEART CATH AND CORONARY ANGIOGRAPHY;  Surgeon: Sherren Mocha, MD;  Location: [facility];  Service: Cardiovascular;  Laterality: N/A;  . TEE WITHOUT CARDIOVERSION N/A 11/17/2018   Procedure: TRANSESOPHAGEAL ECHOCARDIOGRAM (TEE);  Surgeon: Sherren Mocha, MD;  Location: [facility];  Service: Open Heart Surgery;  Laterality: N/A;     A IV Location/Drains/Wounds Patient Lines/Drains/Airways Status   Active Line/Drains/Airways    Name:   Placement date:   Placement time:   Site:   Days:   Peripheral IV 11/27/18 Left Antecubital   11/27/18    1219    Antecubital   less than 1   Incision (Closed) 11/17/18 Chest Left   11/17/18    1302     10   Incision (Closed) 11/17/18 Groin Left   11/17/18    1302     10   Incision (Closed) 11/17/18 Groin Right   11/17/18    1322     10          Intake/Output Last 24 hours  Intake/Output Summary (Last 24 hours) at 11/27/2018 2034 Last  data filed at 11/27/2018 1451 Gross per 24 hour  Intake -  Output 525 ml  Net -525 ml    Labs/Imaging Results for orders placed or performed during the hospital encounter of 11/27/18 (from the past 48 hour(s))  Basic metabolic panel     Status: Abnormal   Collection Time: 11/27/18 12:41 PM  Result Value Ref Range   Sodium 127 (L) 135 - 145 mmol/L   Potassium 4.4 3.5 - 5.1 mmol/L   Chloride 92 (L) 98 - 111 mmol/L   CO2 25 22 - 32 mmol/L   Glucose, Bld 119 (H) 70 - 99 mg/dL   BUN 13 8 - 23 mg/dL   Creatinine, Ser 0.92 0.61 - 1.24 mg/dL   Calcium 8.8 (L) 8.9 -  10.3 mg/dL   GFR calc non Af Amer >60 >60 mL/min   GFR calc Af Amer >60 >60 mL/min   Anion gap 10 5 - 15    Comment: Performed at [facility], [place] [address], [place], [place] [address]  CBC with Differential     Status: Abnormal   Collection Time: 11/27/18 12:41 PM  Result Value Ref Range   WBC 6.1 4.0 - 10.5 K/uL   RBC 2.85 (L) 4.22 - 5.81 MIL/uL   Hemoglobin 9.6 (L) 13.0 - 17.0 g/dL   HCT 28.2 (L) 39.0 - 52.0 %   MCV 98.9 80.0 - 100.0 fL   MCH 33.7 26.0 - 34.0 pg   MCHC 34.0 30.0 - 36.0 g/dL   RDW 12.5 11.5 - 15.5 %   Platelets 316 150 - 400 K/uL   nRBC 0.0 0.0 - 0.2 %   Neutrophils Relative % 77 %   Neutro Abs 4.7 1.7 - 7.7 K/uL   Lymphocytes Relative 13 %   Lymphs Abs 0.8 0.7 - 4.0 K/uL   Monocytes Relative 8 %   Monocytes Absolute 0.5 0.1 - 1.0 K/uL   Eosinophils Relative 1 %   Eosinophils Absolute 0.0 0.0 - 0.5 K/uL   Basophils Relative 0 %   Basophils Absolute 0.0 0.0 - 0.1 K/uL   Immature Granulocytes 1 %   Abs Immature Granulocytes 0.07 0.00 - 0.07 K/uL    Comment: Performed at [facility] [address] [place], [place] [address]  Urinalysis, Routine w reflex microscopic     Status: Abnormal   Collection Time: 11/27/18  1:24 PM  Result Value Ref Range   Color, Urine YELLOW YELLOW   APPearance HAZY (A) CLEAR   Specific Gravity, Urine 1.008 1.005 - 1.030   pH 7.0 5.0 - 8.0   Glucose, UA NEGATIVE NEGATIVE mg/dL   Hgb urine dipstick NEGATIVE NEGATIVE   Bilirubin Urine NEGATIVE NEGATIVE   Ketones, ur NEGATIVE NEGATIVE mg/dL   Protein, ur NEGATIVE NEGATIVE mg/dL   Nitrite NEGATIVE NEGATIVE   Leukocytes,Ua NEGATIVE NEGATIVE    Comment: Performed at [facility] [address], [place], [place] [address]   Ct Head Wo Contrast  Result Date: 11/27/2018 CLINICAL DATA:  Syncopal episode. EXAM: CT HEAD WITHOUT CONTRAST TECHNIQUE: Contiguous axial images were obtained from the base of the skull through the vertex without intravenous contrast.  COMPARISON:  02/25/2017 FINDINGS: Brain: Age related atrophy. Chronic small-vessel ischemic changes of the cerebral hemispheric white matter. No sign of acute infarction, mass lesion, hemorrhage, hydrocephalus or extra-axial collection. Vascular: There is atherosclerotic calcification of the major vessels at the base of the brain. Skull: Negative Sinuses/Orbits: Clear/normal Other: None IMPRESSION: Atrophy and chronic  small-vessel ischemic changes of the cerebral hemispheric white matter. No acute finding. Electronically Signed   By: Nelson Chimes M.D.   On: 11/27/2018 14:43   Dg Chest Port 1 View  Result Date: 11/27/2018 CLINICAL DATA:  Syncope EXAM: PORTABLE CHEST 1 VIEW COMPARISON:  Portable exam 1445 hours compared to 11/17/2018 FINDINGS: Normal heart size post TAVR. Mediastinal contours and pulmonary vascularity normal. Atherosclerotic calcification aorta. Lungs clear. No pulmonary infiltrate, pleural effusion or pneumothorax. Osseous demineralization with minimal scattered endplate spur formation. IMPRESSION: No acute abnormalities. Electronically Signed   By: Lavonia Dana M.D.   On: 11/27/2018 15:04   US Scrotum W/doppler  Result Date: 11/27/2018 CLINICAL DATA:  BILATERAL testicular pain for 1-2 years EXAM: SCROTAL ULTRASOUND DOPPLER ULTRASOUND OF THE TESTICLES TECHNIQUE: Complete ultrasound examination of the testicles, epididymis, and other scrotal structures was performed. Color and spectral Doppler ultrasound were also utilized to evaluate blood flow to the testicles. COMPARISON:  None FINDINGS: Right testicle Measurements: 3.7 x 2.0 x 2.8 cm. Normal echogenicity without mass or calcification. Internal blood flow present on color Doppler imaging. Left testicle Measurements: 3.0 x 2.5 x 1.5 cm. Normal echogenicity. Focus of tubular ectasia of the rete testis is identified. No additional testicular mass or calcification. Internal blood flow present on color Doppler imaging, symmetric with RIGHT. Right  epididymis:  Normal in size and appearance. Left epididymis:  Small cyst within LEFT epididymis 5 x 6 x 5 mm. Hydrocele:  Small BILATERAL hydroceles Varicocele:  None visualized. Pulsed Doppler interrogation of both testes demonstrates normal low resistance arterial and venous waveforms bilaterally. IMPRESSION: Small focus of tubular ectasia of the LEFT rete testis. Small LEFT epididymal cyst 6 mm greatest size. Otherwise normal exam. Electronically Signed   By: Lavonia Dana M.D.   On: 11/27/2018 14:42    Pending Labs Unresulted Labs (From admission, onward)    Start     Ordered   11/28/18 0500  Heparin level (unfractionated)  Daily,   R     11/27/18 1857   11/28/18 0500  CBC  Daily,   R     11/27/18 1857   11/27/18 1841  SARS CORONAVIRUS 2 Nasal Swab Aptima Multi Swab  (Asymptomatic/Tier 2 Patients Labs)  Once,   STAT    Question Answer Comment  Is this test for diagnosis or screening Screening   Symptomatic for COVID-19 as defined by CDC No   Hospitalized for COVID-19 No   Admitted to ICU for COVID-19 No   Previously tested for COVID-19 Yes   Resident in a congregate (group) care setting No   Employed in healthcare setting No      11/27/18 1840   Signed and Held  CBC  Tomorrow morning,   R     Signed and Held   Signed and Held  Basic metabolic panel  Tomorrow morning,   R     Signed and Held          Vitals/Pain Today's Vitals   11/27/18 1730 11/27/18 1745 11/27/18 1845 11/27/18 2000  BP: (!) 171/95 (!) 169/93 (!) 151/95 (!) 144/86  Pulse: 69 76 78 74  Resp: 14 16 18 19   Temp:      TempSrc:      SpO2: 100% 100% 100% 100%  Weight:      Height:      PainSc:        Isolation Precautions No active isolations  Medications Medications  heparin ADULT infusion 100 units/mL (25000 units/21mL sodium chloride 0.45%) (  950 Units/hr Intravenous New Bag/Given 11/27/18 1943)  heparin bolus via infusion 3,300 Units (3,300 Units Intravenous Bolus from Bag 11/27/18 1942)     Mobility walks with person assist Low fall risk   Focused Assessments Cardiac Assessment Handoff:  Cardiac Rhythm: Atrial fibrillation No results found for: CKTOTAL, CKMB, CKMBINDEX, TROPONINI No results found for: DDIMER Does the Patient currently have chest pain? No      R Recommendations: See Admitting Provider Note  Report given to:   Additional Notes:

## 2018-11-27 NOTE — ED Triage Notes (Signed)
Patient arrived via EMS. Dizziness and loss of consciousness while out walking with son. Recent heart valve replaced last week. Denies chest pain, shortness of breath. Alert and oriented. Complains of slight headache.

## 2018-11-27 NOTE — ED Notes (Signed)
Pt ambulated to the bathroom with a walker.

## 2018-11-28 ENCOUNTER — Observation Stay (HOSPITAL_COMMUNITY): Payer: No Typology Code available for payment source

## 2018-11-28 DIAGNOSIS — I4819 Other persistent atrial fibrillation: Secondary | ICD-10-CM

## 2018-11-28 DIAGNOSIS — R55 Syncope and collapse: Secondary | ICD-10-CM | POA: Diagnosis not present

## 2018-11-28 LAB — CBC
HCT: 30 % — ABNORMAL LOW (ref 39.0–52.0)
Hemoglobin: 10.5 g/dL — ABNORMAL LOW (ref 13.0–17.0)
MCH: 33.9 pg (ref 26.0–34.0)
MCHC: 35 g/dL (ref 30.0–36.0)
MCV: 96.8 fL (ref 80.0–100.0)
Platelets: 327 10*3/uL (ref 150–400)
RBC: 3.1 MIL/uL — ABNORMAL LOW (ref 4.22–5.81)
RDW: 12.6 % (ref 11.5–15.5)
WBC: 6.6 10*3/uL (ref 4.0–10.5)
nRBC: 0 % (ref 0.0–0.2)

## 2018-11-28 LAB — BASIC METABOLIC PANEL
Anion gap: 11 (ref 5–15)
BUN: 9 mg/dL (ref 8–23)
CO2: 25 mmol/L (ref 22–32)
Calcium: 8.9 mg/dL (ref 8.9–10.3)
Chloride: 95 mmol/L — ABNORMAL LOW (ref 98–111)
Creatinine, Ser: 0.82 mg/dL (ref 0.61–1.24)
GFR calc Af Amer: >60 mL/min (ref >60)
GFR calc non Af Amer: >60 mL/min (ref >60)
Glucose, Bld: 111 mg/dL — ABNORMAL HIGH (ref 70–99)
Potassium: 4 mmol/L (ref 3.5–5.1)
Sodium: 131 mmol/L — ABNORMAL LOW (ref 135–145)

## 2018-11-28 LAB — SARS CORONAVIRUS 2 (TAT 6-24 HRS): SARS Coronavirus 2: NEGATIVE

## 2018-11-28 LAB — GLUCOSE, CAPILLARY: Glucose-Capillary: 175 mg/dL — ABNORMAL HIGH (ref 70–99)

## 2018-11-28 LAB — HEPARIN LEVEL (UNFRACTIONATED)
Heparin Unfractionated: 0.33 IU/mL (ref 0.30–0.70)
Heparin Unfractionated: 0.27 IU/mL — ABNORMAL LOW (ref 0.30–0.70)

## 2018-11-28 NOTE — Progress Notes (Signed)
EEG complete - results pending 

## 2018-11-28 NOTE — Progress Notes (Signed)
admit pt to room, pt is stable, orient to room  And care plan, verbalized understanding, order low bed mattress on floor per safety policy, pt complains dizziness and weak when get up to walk,place condom cath .pt rest in bed, continue monitor

## 2018-11-28 NOTE — Progress Notes (Signed)
   Vital Signs MEWS/VS Documentation      11/28/2018 0739 11/28/2018 0815 11/28/2018 0910 11/28/2018 1330   MEWS Score:  0  0  0  0   MEWS Score Color:  Green  Green  Green  Green   Resp:  -  18  -  18   Pulse:  -  67  -  -   BP:  -  (!) 152/89  (!) 142/91  121/81   Temp:  -  97.8 F (36.6 C)  -  -   O2 Device:  -  Room Air  -  Nasal Cannula   O2 Flow Rate (L/min):  -  -  -  2 L/min   Level of Consciousness:  Alert  -  -  Stephen Wolf 11/28/2018,2:14 PM

## 2018-11-28 NOTE — Consult Note (Addendum)
NEUROLOGY CONSULT  Reason for Consult: neurologic opinion for unusual spells Referring Physician: Dr Marlou Porch  CC: "I just feel weak"  HPI: Stephen Wolf is an 85 y.o. male with a history of HTN, HLD, PAD, iron deficiency anemia, multivessel CAD, rheumatic fever during childhood and severe AS s/p TAVR 11/17/18, who was d/c home on 7/29. On 8/7 the pt c/o dizziness while out for a walk with his son and pt described his "legs just gave out", he was assisted down to the driveway pavement by son at his side. No LOC, but pt was unable to respond during the spell. Pt reports feeling the sensation of syncope and knowing he was too weak to stand any longer. EMS was summoned and he was brought to the ER and admitted for further work up. He was recently treated for urinary infection with Cipro and has also had new onset of Afib. There is a report of 103mo of frustrating longstanding symptoms of chronic fatigue with general steady decline in activity.   Today while being assisted to the bathroom by staff, he was straining to have a bowel movement and upon completion and standing back up, he suddenly became stiff and had a blank stare and drool. He did not fall and was assisted back to bed. BP by time he was put back to bed was unremarkable. Telemetry had no overt changes or arrhythmias. The pt says that he felt terrible and became very dizzy like he was going to pass out, but did not.   Past Medical History Past Medical History:  Diagnosis Date  . Anemia    iron defiency  . Anxiety   . Coronary artery disease involving left main coronary artery 10/16/2018  . Hearing loss    bilateral - no hearing aids  . History of colon cancer   . Hyperlipidemia    MIXED  . Hypertension   . Osteopenia   . PAD (peripheral artery disease) ([place])   . Pulmonary nodule   . 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 Severe aortic stenosis   . Thoracic aortic ectasia ([place])   . Uses walker   . Vitamin D deficiency     Past Surgical History Past Surgical History:  Procedure Laterality Date  . CARDIAC CATHETERIZATION    . COLONOSCOPY  08/2009   SCREENING..HYPLASTIC POLYPECTOMY..DR. DARRELUS  . INTRAVASCULAR PRESSURE WIRE/FFR STUDY N/A 10/16/2018   Procedure: INTRAVASCULAR PRESSURE WIRE/FFR STUDY;  Surgeon: Sherren Mocha, MD;  Location: [facility];  Service: Cardiovascular;  Laterality: N/A;  . RIGHT/LEFT HEART CATH AND CORONARY ANGIOGRAPHY N/A 10/16/2018   Procedure: RIGHT/LEFT HEART CATH AND CORONARY ANGIOGRAPHY;  Surgeon: Sherren Mocha, MD;  Location: [facility];  Service: Cardiovascular;  Laterality: N/A;  . TEE WITHOUT CARDIOVERSION N/A 11/17/2018   Procedure: TRANSESOPHAGEAL ECHOCARDIOGRAM (TEE);  Surgeon: Sherren Mocha, MD;  Location: [facility];  Service: Open Heart Surgery;  Laterality: N/A;    Family History Family History  Problem Relation Age of Onset  . Hyperlipidemia Sister   . Hypertension Sister   . Heart disease Sister   . Hyperlipidemia Brother   . Hypertension Brother   . Heart disease Brother     Social History    reports that he has never smoked. He has never used smokeless tobacco. He reports that he does not drink alcohol or use drugs.  Allergies Allergies  Allergen Reactions  .  Codeine Phosphate [Codeine] Shortness Of Breath  . Sulfa Antibiotics Shortness Of Breath  . Asa [Aspirin] Other (See Comments)    MILD ANEMIA    Home Medications Medications Prior to Admission  Medication Sig Dispense Refill  . ALPRAZolam (XANAX) 0.25 MG tablet Take 0.25 mg by mouth at bedtime.    [name] amLODipine (NORVASC) 5 MG tablet Take 5 mg by mouth 2 (two) times a day.     [name] aspirin EC 81 MG EC tablet Take 1 tablet (81 mg total) by mouth daily. 90 tablet 1  . clopidogrel (PLAVIX) 75 MG tablet Take 1 tablet (75 mg total) by mouth daily with breakfast. 90 tablet 1  .  docusate sodium (COLACE) 100 MG capsule Take 100 mg by mouth 2 (two) times daily.     . finasteride (PROSCAR) 5 MG tablet Take 5 mg by mouth at bedtime.     . Iron-FA-B Cmp-C-Biot-Probiotic (FUSION PLUS PO) Take 1 capsule by mouth at bedtime.     [name] losartan (COZAAR) 50 MG tablet Take 50 mg by mouth daily.    . Multiple Vitamin (MULTIVITAMIN) tablet Take 1 tablet by mouth at bedtime.     . Omega-3 Fatty Acids (FISH OIL) 1000 MG CAPS Take 1,000 mg by mouth at bedtime.     . pantoprazole (PROTONIX) 40 MG tablet Take 1 tablet (40 mg total) by mouth daily. 90 tablet 1  . simvastatin (ZOCOR) 20 MG tablet Take 20 mg by mouth at bedtime.     [name] VITAMIN D, CHOLECALCIFEROL, PO Take 1 tablet by mouth at bedtime.    [name] amoxicillin (AMOXIL) 500 MG tablet Take 4 tablets (2,000 mg total) by mouth as directed. Take 1 hour prior to dental work, including routine cleanings. 12 tablet 12    Hospital Medications . amLODipine  5 mg Oral Daily  . atorvastatin  10 mg Oral QHS  . docusate sodium  100 mg Oral BID  . finasteride  5 mg Oral QHS  . losartan  50 mg Oral Daily  . multivitamin with minerals  1 tablet Oral QHS  . omega-3 acid ethyl esters  1 g Oral Daily  . pantoprazole  40 mg Oral Daily     ROS: Pt and son give the following info:  General ROS: + fatigue for 49mo. negative for - chills, fever, night sweats, weight gain or weight loss Psychological ROS: negative for - behavioral disorder, hallucinations, memory difficulties, mood swings or suicidal ideation Ophthalmic ROS: negative for - blurry vision, double vision, eye pain or loss of vision ENT ROS: negative for - epistaxis, nasal discharge, oral lesions, sore throat, tinnitus or vertigo Allergy and Immunology ROS: negative for - hives or itchy/watery eyes Hematological and Lymphatic ROS: negative for - bleeding problems, bruising or swollen lymph nodes Endocrine ROS: negative for - galactorrhea, hair pattern changes, polydipsia/polyuria or  temperature intolerance Respiratory ROS: negative for - cough, hemoptysis, shortness of breath or wheezing Cardiovascular ROS: + irreg heartbeat. negative for - chest pain, dyspnea on exertion, edema  Gastrointestinal ROS: negative for - abdominal pain, diarrhea, hematemesis, nausea/vomiting or stool incontinence Genito-Urinary ROS: + testicular pain. negative for - dysuria, hematuria, incontinence or urinary frequency/urgency Musculoskeletal ROS: negative for - joint swelling or muscular weakness Neurological ROS: as noted in HPI: "dizziness" and "lightheadedness" Dermatological ROS: negative for rash and skin lesion changes   Physical Examination:  Vitals:   11/28/18 0618 11/28/18 0815 11/28/18 0910 11/28/18 1330  BP: 121/70 (!) 152/89 (!) 142/91 121/81  Pulse:  74 67    Resp: 16 18  18   Temp: (!) 97.5 F (36.4 C) 97.8 F (36.6 C)    TempSrc: Oral Oral    SpO2: 99% 100%  100%  Weight: 62 kg     Height:        General - frail, elderly. No acute distress Heart - Regular rate and rhythm - no murmer Lungs - Clear to auscultation Abdomen - Soft - non tender Extremities - Distal pulses intact - no edema Skin - Warm and dry  Neurologic Examination:   Mental Status:  Alert, oriented, thought content appropriate. Speech without evidence of dysarthria or aphasia. Able to follow 3 step commands without difficulty.  Cranial Nerves:  II-bilateral visual fields intact III/IV/VI-Pupils were equal and reacted. Extraocular movements were full.  V/VII-no facial numbness and no facial weakness.  VIII-hearing normal.  X-normal speech and symmetrical palatal movement.  XII-midline tongue extension  Motor: Right : Upper extremity   5/5    Left:     Upper extremity   5/5  Lower extremity   5/5     Lower extremity   5/5 Tone and bulk:normal tone throughout; no atrophy noted Sensory: Intact to light touch in all extremities. Deep Tendon Reflexes: 2/4 throughout Plantars: Downgoing  bilaterally  Cerebellar: Normal finger to nose and heel to shin bilaterally. Gait: not tested   LABORATORY STUDIES:  Basic Metabolic Panel: Recent Labs  Lab 11/25/18 1616 11/27/18 1241 11/28/18 0335  NA 127* 127* 131*  K 4.9 4.4 4.0  CL 90* 92* 95*  CO2 22 25 25   GLUCOSE 98 119* 111*  BUN 14 13 9   CREATININE 0.90 0.92 0.82  CALCIUM 9.4 8.8* 8.9    Liver Function Tests: No results for input(s): AST, ALT, ALKPHOS, BILITOT, PROT, ALBUMIN in the last 168 hours. No results for input(s): LIPASE, AMYLASE in the last 168 hours. No results for input(s): AMMONIA in the last 168 hours.  CBC: Recent Labs  Lab 11/25/18 1616 11/27/18 1241 11/28/18 0335  WBC 6.4 6.1 6.6  NEUTROABS  --  4.7  --   HGB 10.4* 9.6* 10.5*  HCT 29.8* 28.2* 30.0*  MCV 98* 98.9 96.8  PLT 289 316 327    Cardiac Enzymes: No results for input(s): CKTOTAL, CKMB, CKMBINDEX, TROPONINI in the last 168 hours.  BNP: Invalid input(s): POCBNP  CBG: Recent Labs  Lab 11/28/18 1333  GLUCAP 175*    Microbiology:   Coagulation Studies: No results for input(s): LABPROT, INR in the last 72 hours.  Urinalysis:  Recent Labs  Lab 11/27/18 1324  COLORURINE YELLOW  LABSPEC 1.008  PHURINE 7.0  GLUCOSEU NEGATIVE  HGBUR NEGATIVE  BILIRUBINUR NEGATIVE  KETONESUR NEGATIVE  PROTEINUR NEGATIVE  NITRITE NEGATIVE  LEUKOCYTESUR NEGATIVE    Lipid Panel:  No results found for: CHOL, TRIG, HDL, CHOLHDL, VLDL, LDLCALC  HgbA1C:  Lab Results  Component Value Date   HGBA1C 5.3 11/13/2018    Urine Drug Screen:  No results found for: LABOPIA, COCAINSCRNUR, LABBENZ, AMPHETMU, THCU, LABBARB   Alcohol Level:  No results for input(s): ETH in the last 168 hours.  Miscellaneous labs:  EKG  EKG  IMAGING: Ct Head Wo Contrast  Result Date: 11/27/2018 CLINICAL DATA:  Syncopal episode. EXAM: CT HEAD WITHOUT CONTRAST TECHNIQUE: Contiguous axial images were obtained from the base of the skull through the vertex  without intravenous contrast. COMPARISON:  02/25/2017 FINDINGS: Brain: Age related atrophy. Chronic small-vessel ischemic changes of the cerebral hemispheric white matter. No sign  of acute infarction, mass lesion, hemorrhage, hydrocephalus or extra-axial collection. Vascular: There is atherosclerotic calcification of the major vessels at the base of the brain. Skull: Negative Sinuses/Orbits: Clear/normal Other: None IMPRESSION: Atrophy and chronic small-vessel ischemic changes of the cerebral hemispheric white matter. No acute finding. Electronically Signed   By: Nelson Chimes M.D.   On: 11/27/2018 14:43   Dg Chest Port 1 View  Result Date: 11/27/2018 CLINICAL DATA:  Syncope EXAM: PORTABLE CHEST 1 VIEW COMPARISON:  Portable exam 1445 hours compared to 11/17/2018 FINDINGS: Normal heart size post TAVR. Mediastinal contours and pulmonary vascularity normal. Atherosclerotic calcification aorta. Lungs clear. No pulmonary infiltrate, pleural effusion or pneumothorax. Osseous demineralization with minimal scattered endplate spur formation. IMPRESSION: No acute abnormalities. Electronically Signed   By: Lavonia Dana M.D.   On: 11/27/2018 15:04   US Scrotum W/doppler  Result Date: 11/27/2018 CLINICAL DATA:  BILATERAL testicular pain for 1-2 years EXAM: SCROTAL ULTRASOUND DOPPLER ULTRASOUND OF THE TESTICLES TECHNIQUE: Complete ultrasound examination of the testicles, epididymis, and other scrotal structures was performed. Color and spectral Doppler ultrasound were also utilized to evaluate blood flow to the testicles. COMPARISON:  None FINDINGS: Right testicle Measurements: 3.7 x 2.0 x 2.8 cm. Normal echogenicity without mass or calcification. Internal blood flow present on color Doppler imaging. Left testicle Measurements: 3.0 x 2.5 x 1.5 cm. Normal echogenicity. Focus of tubular ectasia of the rete testis is identified. No additional testicular mass or calcification. Internal blood flow present on color Doppler imaging,  symmetric with RIGHT. Right epididymis:  Normal in size and appearance. Left epididymis:  Small cyst within LEFT epididymis 5 x 6 x 5 mm. Hydrocele:  Small BILATERAL hydroceles Varicocele:  None visualized. Pulsed Doppler interrogation of both testes demonstrates normal low resistance arterial and venous waveforms bilaterally. IMPRESSION: Small focus of tubular ectasia of the LEFT rete testis. Small LEFT epididymal cyst 6 mm greatest size. Otherwise normal exam. Electronically Signed   By: Lavonia Dana M.D.   On: 11/27/2018 14:42     Assessment/Plan: 85yr old man with a steady 60mo decline in activity and recent TAVR on 7/28 is admitted with spells of syncope.   # Syncope- Pt reports "dizziness" since TAVR.  # Chronic fatigue- pt says he has felt weak and fatigued for ~46mo. He expected this to improved after TAVR, but it has not # Spells- seem to be r/t dizziness and weakness, no clear LOC and pt is aware during these spells. Will ck EEG to look for seizure focus. Ddx: orthostatic hypotension # new onset Afib- could be contributing to gen weakness/dizziness # Hyponatremia- 127-130  RECS: EEG Orthostatic BPs Up only with assitance Attending neurologist's note to follow  Desiree Metzger-Cihelka, ARNP-C, ANVP-BC Pager: [phone]  I have seen the patient and reviewed the above note.  He did lose consciousness with the first episode, with the second he just became severely lightheaded, but did not actually lose consciousness.  He was able to lean over and states that he felt better once he was sitting down.   With both episodes, he had just recently stood up, and I suspect that this is orthostasis rather than seizure.  With a negative EEG, this is further supportive, I will check orthostatic vitals, however already with a negative EEG I do not think I would favor starting antiepileptic therapy in any case.  I would favor conservative treatment for suspected orthostatic hypotension, no  further work-up from a neurologic perspective.  Please call with further questions or concerns.  Roland Rack, MD [facility] [phone]  If 7pm- 7am, please page neurology on call as listed in [place].

## 2018-11-28 NOTE — Progress Notes (Signed)
Patient was being assisted from restroom to the bed, by son and NT [place]. Just short of the bed, pt became rigid in his stature and started to drool, whilst not responding immediately to verbal stimuli from staff or son.   Per Son, this is similar to the episode he had yesterday. Pt is 11 days s/p MV Replacement.   Negative head CT yesterday. CBG 175 s/p lunch. VS WNL in flow sheet. Per CCMD no significant events noted on telemetry during this period of time. Pt reported having to strain during the BM he had prior to ambulating. EKG performed, on chart.    Dr Marlou Porch notified of event/work-up.

## 2018-11-28 NOTE — Procedures (Signed)
History: 85 year old being evaluated for syncope  Sedation: None  Technique: This is a 21 channel routine scalp EEG performed at the bedside with bipolar and monopolar montages arranged in accordance to the international 10/20 system of electrode placement. One channel was dedicated to EKG recording.    Background: The background consists of intermixed alpha and beta activities. There is a well defined posterior dominant rhythm of 8 hz that attenuates with eye opening. Sleep is not recorded.  There is frequent blink artifact  Photic stimulation: Physiologic driving is   EEG Abnormalities: None  Clinical Interpretation: This normal EEG is recorded in the waking state. There was no seizure or seizure predisposition recorded on this study. Please note that lack of epileptiform activity on EEG does not preclude the possibility of epilepsy.   Roland Rack, MD [facility] [phone]  If 7pm- 7am, please page neurology on call as listed in [place].

## 2018-11-28 NOTE — Plan of Care (Signed)

## 2018-11-28 NOTE — Progress Notes (Signed)
    Had an episode where after going to the bathroom he got back up to go to his bed and he became stiff and rigid, blank stare, drool.  He did not fall over.  His son equated this to another "episode ".  He is now stating that he has a mild headache.  Telemetry did not reveal any overt abnormalities. Blood sugar was normal. Blood pressure was unremarkable shortly thereafter.  I decided to consult neurology, Dr. Leonel Ramsay, for his opinion as well.  Head CT yesterday did not show any abnormalities.  Candee Furbish, MD

## 2018-11-28 NOTE — Progress Notes (Signed)
Progress Note  Patient Name: Stephen Wolf Date of Encounter: 11/28/2018  Primary Cardiologist: No primary care provider on file.  Dr. Claudie Leach TAVR: Dr. Burt Knack and Dr. Roxy Manns  Subjective   In talking with his son, he has been feeling weak for 8 months, steady decline.  Used to be able to walk quite a bit without difficulty.  His episode yesterday was significantly worse, legs felt wobbly, went down to the ground with assistance.  Overnight- telemetry demonstrates occasional mild bradycardia in the mid 40s but for the most part, he is maintaining atrial fibrillation with adequate rate control.  No significant pauses.  Inpatient Medications    Scheduled Meds:  amLODipine  5 mg Oral Daily   atorvastatin  10 mg Oral QHS   docusate sodium  100 mg Oral BID   finasteride  5 mg Oral QHS   losartan  50 mg Oral Daily   multivitamin with minerals  1 tablet Oral QHS   omega-3 acid ethyl esters  1 g Oral Daily   pantoprazole  40 mg Oral Daily   Continuous Infusions:  heparin 950 Units/hr (11/27/18 2200)   PRN Meds: acetaminophen, ondansetron (ZOFRAN) IV   Vital Signs    Vitals:   11/27/18 2203 11/28/18 0618 11/28/18 0815 11/28/18 0910  BP: (!) 152/87 121/70 (!) 152/89 (!) 142/91  Pulse: 67 74 67   Resp: 18 16 18    Temp: 98.7 F (37.1 C) (!) 97.5 F (36.4 C) 97.8 F (36.6 C)   TempSrc: Oral Oral Oral   SpO2: 100% 99% 100%   Weight: 60.9 kg 62 kg    Height:        Intake/Output Summary (Last 24 hours) at 11/28/2018 1042 Last data filed at 11/28/2018 0815 Gross per 24 hour  Intake 102.44 ml  Output 1575 ml  Net -1472.56 ml   Last 3 Weights 11/28/2018 11/27/2018 11/27/2018  Weight (lbs) 136 lb 11 oz 134 lb 4.8 oz 145 lb  Weight (kg) 62 kg 60.918 kg 65.772 kg      Telemetry    As above - Personally Reviewed  ECG    AFIB - Personally Reviewed  Physical Exam   GEN: No acute distress.  Elderly Neck: No JVD Cardiac:  Irregularly irregular, no murmurs, rubs, or  gallops.  Respiratory: Clear to auscultation bilaterally. GI: Soft, nontender, non-distended  MS: No edema; No deformity. Neuro:  Nonfocal  Psych: Normal affect   Labs    High Sensitivity Troponin:  No results for input(s): TROPONINIHS in the last 720 hours.    Cardiac EnzymesNo results for input(s): TROPONINI in the last 168 hours. No results for input(s): TROPIPOC in the last 168 hours.   Chemistry Recent Labs  Lab 11/25/18 1616 11/27/18 1241 11/28/18 0335  NA 127* 127* 131*  K 4.9 4.4 4.0  CL 90* 92* 95*  CO2 22 25 25   GLUCOSE 98 119* 111*  BUN 14 13 9   CREATININE 0.90 0.92 0.82  CALCIUM 9.4 8.8* 8.9  GFRNONAA 74 >60 >60  GFRAA 86 >60 >60  ANIONGAP  --  10 11     Hematology Recent Labs  Lab 11/25/18 1616 11/27/18 1241 11/28/18 0335  WBC 6.4 6.1 6.6  RBC 3.03* 2.85* 3.10*  HGB 10.4* 9.6* 10.5*  HCT 29.8* 28.2* 30.0*  MCV 98* 98.9 96.8  MCH 34.3* 33.7 33.9  MCHC 34.9 34.0 35.0  RDW 12.7 12.5 12.6  PLT 289 316 327    BNPNo results for input(s): BNP, PROBNP  in the last 168 hours.   DDimer No results for input(s): DDIMER in the last 168 hours.   Radiology    Ct Head Wo Contrast  Result Date: 11/27/2018 CLINICAL DATA:  Syncopal episode. EXAM: CT HEAD WITHOUT CONTRAST TECHNIQUE: Contiguous axial images were obtained from the base of the skull through the vertex without intravenous contrast. COMPARISON:  02/25/2017 FINDINGS: Brain: Age related atrophy. Chronic small-vessel ischemic changes of the cerebral hemispheric white matter. No sign of acute infarction, mass lesion, hemorrhage, hydrocephalus or extra-axial collection. Vascular: There is atherosclerotic calcification of the major vessels at the base of the brain. Skull: Negative Sinuses/Orbits: Clear/normal Other: None IMPRESSION: Atrophy and chronic small-vessel ischemic changes of the cerebral hemispheric white matter. No acute finding. Electronically Signed   By: Nelson Chimes M.D.   On: 11/27/2018 14:43    Dg Chest Port 1 View  Result Date: 11/27/2018 CLINICAL DATA:  Syncope EXAM: PORTABLE CHEST 1 VIEW COMPARISON:  Portable exam 1445 hours compared to 11/17/2018 FINDINGS: Normal heart size post TAVR. Mediastinal contours and pulmonary vascularity normal. Atherosclerotic calcification aorta. Lungs clear. No pulmonary infiltrate, pleural effusion or pneumothorax. Osseous demineralization with minimal scattered endplate spur formation. IMPRESSION: No acute abnormalities. Electronically Signed   By: Lavonia Dana M.D.   On: 11/27/2018 15:04   US Scrotum W/doppler  Result Date: 11/27/2018 CLINICAL DATA:  BILATERAL testicular pain for 1-2 years EXAM: SCROTAL ULTRASOUND DOPPLER ULTRASOUND OF THE TESTICLES TECHNIQUE: Complete ultrasound examination of the testicles, epididymis, and other scrotal structures was performed. Color and spectral Doppler ultrasound were also utilized to evaluate blood flow to the testicles. COMPARISON:  None FINDINGS: Right testicle Measurements: 3.7 x 2.0 x 2.8 cm. Normal echogenicity without mass or calcification. Internal blood flow present on color Doppler imaging. Left testicle Measurements: 3.0 x 2.5 x 1.5 cm. Normal echogenicity. Focus of tubular ectasia of the rete testis is identified. No additional testicular mass or calcification. Internal blood flow present on color Doppler imaging, symmetric with RIGHT. Right epididymis:  Normal in size and appearance. Left epididymis:  Small cyst within LEFT epididymis 5 x 6 x 5 mm. Hydrocele:  Small BILATERAL hydroceles Varicocele:  None visualized. Pulsed Doppler interrogation of both testes demonstrates normal low resistance arterial and venous waveforms bilaterally. IMPRESSION: Small focus of tubular ectasia of the LEFT rete testis. Small LEFT epididymal cyst 6 mm greatest size. Otherwise normal exam. Electronically Signed   By: Lavonia Dana M.D.   On: 11/27/2018 14:42    Cardiac Studies   Cardiac catheterization 09/2018- ostial occlusion  RCA with left-to-right collaterals, moderate stenosis of left main, moderate stenosis of proximal circumflex nonobstructive LAD.  Patient Profile     85 y.o. male with syncope, new TAVR, new onset A. fib, CAD, hypertension, chronic fatigue over the last 8 months  Assessment & Plan    Syncope/chronic fatigue/TAVR - Per son, has had a slow and steady decline over the last several months.  Certainly the episode that caused him to feel extremely "wobbly "may have been significant bradycardia.  At this time, telemetry does not reveal any significant pauses or dramatic bradycardia.  Blood pressure seems reasonable.  A little bit on the higher side.  New onset atrial fibrillation, persistent - Currently on IV heparin in case pacemaker procedure is needed.  If no pacemaker is needed, will transition to Eliquis and if he remains in atrial fibrillation for the next 3 weeks, we could go ahead and perform cardioversion.  I do not think that he requires  urgent cardioversion at this time.  Chronic fatigue - Frustrating longstanding symptom over the last 8 months.  His son would really like to see him get better, have more energy.  He was stating how active he was prior to 8 months ago. - TSH was normal.  Hyponatremia - 127-130 -We will check a.m. cortisol tomorrow  I would like to observe him for another 24 hours here to see how his heart rate reacts.  I would also like him to ambulate with assistance.   For questions or updates, please contact [place] Please consult [email or web address] for contact info under        Signed, Candee Furbish, MD  11/28/2018, 10:42 AM

## 2018-11-28 NOTE — Progress Notes (Signed)
ANTICOAGULATION CONSULT NOTE - Follow-Up Consult  Pharmacy Consult for heparin Indication: new onset atrial fibrillation  Allergies  Allergen Reactions  . Codeine Phosphate [Codeine] Shortness Of Breath  . Sulfa Antibiotics Shortness Of Breath  . Asa [Aspirin] Other (See Comments)    MILD ANEMIA    Patient Measurements: Height: 5' 8.5" (174 cm) Weight: 136 lb 11 oz (62 kg) IBW/kg (Calculated) : 69.55 Heparin Dosing Weight: 65.8 kg  Vital Signs: Temp: 97.8 F (36.6 C) (08/08 0815) Temp Source: Oral (08/08 0815) BP: 142/91 (08/08 0910) Pulse Rate: 67 (08/08 0815)  Labs: Recent Labs    11/25/18 1616 11/27/18 1241 11/28/18 0335  HGB 10.4* 9.6* 10.5*  HCT 29.8* 28.2* 30.0*  PLT 289 316 327  HEPARINUNFRC  --   --  0.33  CREATININE 0.90 0.92 0.82    Estimated Creatinine Clearance: 51.5 mL/min (by C-G formula based on SCr of 0.82 mg/dL).   Medical History: Past Medical History:  Diagnosis Date  . Anemia    iron defiency  . Anxiety   . Coronary artery disease involving left main coronary artery 10/16/2018  . Hearing loss    bilateral - no hearing aids  . History of colon cancer   . Hyperlipidemia    MIXED  . Hypertension   . Osteopenia   . PAD (peripheral artery disease) ([place])   . Pulmonary nodule   . 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 Severe aortic stenosis   . Thoracic aortic ectasia ([place])   . Uses walker   . Vitamin D deficiency     Medications:    Assessment: 85 yo M admitted for syncope, found to have new-onset Afib. Pt is s/pP TVAR on 7/28.  On aspirin and Plavix PTA, which are currently held inpatient while awaiting decision regarding pacemaker. Pharmacy consulted to dose heparin. Plans to transition to apixaban at discharge per H&P.  Heparin level is therapeutic on 950 units/hr.  CBC low, Hgb 9.6, Plt wnl 316. No bleeding  noted.  Goal of Therapy:  Heparin level 0.3-0.7 units/ml Monitor platelets by anticoagulation protocol: Yes   Plan:  Continue Heparin infusion at 950 units/hr Heparin level in 8 hours for confirmation Monitor daily heparin levels, CBC, and s/sx of bleeding F/U plans for pacemaker and transition to Eliquis at discharge  Thank you for letting pharmacy be a part of this patient's care.  [employer], Pharm.D., BCPS Clinical Pharmacist Clinical phone for 11/28/2018 from 8:30-4:00 is [phone].  **Pharmacist phone directory can now be found on [email or web address] ([email or web address]).  Listed under [place].  11/28/2018 10:40 AM

## 2018-11-28 NOTE — Progress Notes (Signed)
ANTICOAGULATION CONSULT NOTE - Follow-Up Consult  Pharmacy Consult for heparin Indication: new onset atrial fibrillation  Allergies  Allergen Reactions  . Codeine Phosphate [Codeine] Shortness Of Breath  . Sulfa Antibiotics Shortness Of Breath  . Asa [Aspirin] Other (See Comments)    MILD ANEMIA    Patient Measurements: Height: 5' 8.5" (174 cm) Weight: 136 lb 11 oz (62 kg) IBW/kg (Calculated) : 69.55 Heparin Dosing Weight: 65.8 kg  Vital Signs: Temp: 97.8 F (36.6 C) (08/08 0815) Temp Source: Oral (08/08 0815) BP: 121/81 (08/08 1330) Pulse Rate: 67 (08/08 0815)  Labs: Recent Labs    11/25/18 1616 11/27/18 1241 11/28/18 0335 11/28/18 1139  HGB 10.4* 9.6* 10.5*  --   HCT 29.8* 28.2* 30.0*  --   PLT 289 316 327  --   HEPARINUNFRC  --   --  0.33 0.27*  CREATININE 0.90 0.92 0.82  --     Estimated Creatinine Clearance: 51.5 mL/min (by C-G formula based on SCr of 0.82 mg/dL).   Medical History: Past Medical History:  Diagnosis Date  . Anemia    iron defiency  . Anxiety   . Coronary artery disease involving left main coronary artery 10/16/2018  . Hearing loss    bilateral - no hearing aids  . History of colon cancer   . Hyperlipidemia    MIXED  . Hypertension   . Osteopenia   . PAD (peripheral artery disease) ([place])   . Pulmonary nodule   . 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 Severe aortic stenosis   . Thoracic aortic ectasia ([place])   . Uses walker   . Vitamin D deficiency     Medications:    Assessment: 85 yo M admitted for syncope, found to have new-onset Afib. Pt is s/pP TVAR on 7/28.  On aspirin and Plavix PTA, which are currently held inpatient while awaiting decision regarding pacemaker. Pharmacy consulted to dose heparin. Plans to transition to apixaban at discharge per H&P.  Heparin level is slightly sub therapeutic on 950  units/hr.  CBC low, Hgb 9.6, Plt wnl 316. No bleeding noted.  Goal of Therapy:  Heparin level 0.3-0.7 units/ml Monitor platelets by anticoagulation protocol: Yes   Plan:  Increase heparin infusion to 1050 units/hr Monitor daily heparin levels, CBC, and s/sx of bleeding F/U plans for pacemaker and transition to Eliquis at discharge  Thank you for letting pharmacy be a part of this patient's care.  [employer], Pharm.D., BCPS Clinical Pharmacist Clinical phone for 11/28/2018 from 8:30-4:00 is [phone].  **Pharmacist phone directory can now be found on [email or web address] ([email or web address]).  Listed under [place].  11/28/2018 3:51 PM

## 2018-11-28 NOTE — Progress Notes (Signed)
Patient was on the commode having a bowel movement, when he had an episode of going unresponsive for a second per NT [facility]. EGK obtained, vitals are WNL, and MD is awre. Patient reported having a headache of 4/10, tylenol was given. Patient reported feeling better, he is in his bed with son at bedside. Will continue to monitor.

## 2018-11-29 DIAGNOSIS — Z20828 Contact with and (suspected) exposure to other viral communicable diseases: Secondary | ICD-10-CM | POA: Diagnosis present

## 2018-11-29 DIAGNOSIS — N433 Hydrocele, unspecified: Secondary | ICD-10-CM | POA: Diagnosis present

## 2018-11-29 DIAGNOSIS — R55 Syncope and collapse: Secondary | ICD-10-CM | POA: Diagnosis present

## 2018-11-29 DIAGNOSIS — R911 Solitary pulmonary nodule: Secondary | ICD-10-CM | POA: Diagnosis present

## 2018-11-29 DIAGNOSIS — N503 Cyst of epididymis: Secondary | ICD-10-CM | POA: Diagnosis present

## 2018-11-29 DIAGNOSIS — I4819 Other persistent atrial fibrillation: Secondary | ICD-10-CM | POA: Diagnosis present

## 2018-11-29 DIAGNOSIS — I951 Orthostatic hypotension: Secondary | ICD-10-CM | POA: Diagnosis present

## 2018-11-29 DIAGNOSIS — I5033 Acute on chronic diastolic (congestive) heart failure: Secondary | ICD-10-CM | POA: Diagnosis present

## 2018-11-29 DIAGNOSIS — I739 Peripheral vascular disease, unspecified: Secondary | ICD-10-CM | POA: Diagnosis present

## 2018-11-29 DIAGNOSIS — I2510 Atherosclerotic heart disease of native coronary artery without angina pectoris: Secondary | ICD-10-CM | POA: Diagnosis present

## 2018-11-29 DIAGNOSIS — Z85038 Personal history of other malignant neoplasm of large intestine: Secondary | ICD-10-CM | POA: Diagnosis not present

## 2018-11-29 DIAGNOSIS — I110 Hypertensive heart disease with heart failure: Secondary | ICD-10-CM | POA: Diagnosis present

## 2018-11-29 DIAGNOSIS — F419 Anxiety disorder, unspecified: Secondary | ICD-10-CM | POA: Diagnosis present

## 2018-11-29 DIAGNOSIS — Z953 Presence of xenogenic heart valve: Secondary | ICD-10-CM | POA: Diagnosis not present

## 2018-11-29 DIAGNOSIS — H9193 Unspecified hearing loss, bilateral: Secondary | ICD-10-CM | POA: Diagnosis present

## 2018-11-29 DIAGNOSIS — E785 Hyperlipidemia, unspecified: Secondary | ICD-10-CM | POA: Diagnosis present

## 2018-11-29 DIAGNOSIS — Z79899 Other long term (current) drug therapy: Secondary | ICD-10-CM | POA: Diagnosis not present

## 2018-11-29 DIAGNOSIS — Z7982 Long term (current) use of aspirin: Secondary | ICD-10-CM | POA: Diagnosis not present

## 2018-11-29 DIAGNOSIS — Z86010 Personal history of colonic polyps: Secondary | ICD-10-CM | POA: Diagnosis not present

## 2018-11-29 DIAGNOSIS — E871 Hypo-osmolality and hyponatremia: Secondary | ICD-10-CM | POA: Diagnosis present

## 2018-11-29 DIAGNOSIS — R51 Headache: Secondary | ICD-10-CM | POA: Diagnosis not present

## 2018-11-29 DIAGNOSIS — M8580 Other specified disorders of bone density and structure, unspecified site: Secondary | ICD-10-CM | POA: Diagnosis present

## 2018-11-29 DIAGNOSIS — Z7902 Long term (current) use of antithrombotics/antiplatelets: Secondary | ICD-10-CM | POA: Diagnosis not present

## 2018-11-29 DIAGNOSIS — Z8249 Family history of ischemic heart disease and other diseases of the circulatory system: Secondary | ICD-10-CM | POA: Diagnosis not present

## 2018-11-29 DIAGNOSIS — I350 Nonrheumatic aortic (valve) stenosis: Secondary | ICD-10-CM | POA: Diagnosis present

## 2018-11-29 LAB — CBC
HCT: 29.5 % — ABNORMAL LOW (ref 39.0–52.0)
Hemoglobin: 10.3 g/dL — ABNORMAL LOW (ref 13.0–17.0)
MCH: 33.7 pg (ref 26.0–34.0)
MCHC: 34.9 g/dL (ref 30.0–36.0)
MCV: 96.4 fL (ref 80.0–100.0)
Platelets: 343 10*3/uL (ref 150–400)
RBC: 3.06 MIL/uL — ABNORMAL LOW (ref 4.22–5.81)
RDW: 12.7 % (ref 11.5–15.5)
WBC: 8.9 10*3/uL (ref 4.0–10.5)
nRBC: 0 % (ref 0.0–0.2)

## 2018-11-29 LAB — CORTISOL-AM, BLOOD: Cortisol - AM: 19.8 ug/dL (ref 6.7–22.6)

## 2018-11-29 LAB — HEPARIN LEVEL (UNFRACTIONATED): Heparin Unfractionated: 0.46 IU/mL (ref 0.30–0.70)

## 2018-11-29 MED ORDER — APIXABAN 2.5 MG PO TABS
2.5000 mg | ORAL_TABLET | Freq: Two times a day (BID) | ORAL | Status: DC
  Administered 2018-11-29 – 2018-11-30 (×3): 2.5 mg via ORAL
  Filled 2018-11-29 (×3): qty 1

## 2018-11-29 MED ORDER — SODIUM CHLORIDE 0.9 % IV SOLN
INTRAVENOUS | Status: AC
  Administered 2018-11-29: 18:00:00 via INTRAVENOUS

## 2018-11-29 NOTE — Progress Notes (Addendum)
   Repeat orthostatics still positive:  Orthostatic VS for the past 24 hrs:  BP- Lying Pulse- Lying BP- Sitting Pulse- Sitting BP- Standing at 0 minutes Pulse- Standing at 0 minutes  11/29/18 1114 128/60 83 122/79 86 (!) 81/53 -  11/29/18 1058 128/60 83 122/79 86 (!) 81/53 80  11/29/18 0106 (!) 148/94 77 113/74 75 (!) 60/41 78   Patient became extremely dizzy with standing and could not stand for 3 minutes. Therefore, will keep patient for additional night. Home Amlodipine and Losartan discontinued this morning.  Darreld Mclean, PA-C 11/29/2018 2:36 PM  ADDENDUM: Discussed with Dr. Marlou Porch - will also gently hydrate with 75cc/hr of NaCl for 24 hours.  Darreld Mclean, PA-C 11/29/2018 4:57 PM

## 2018-11-29 NOTE — Progress Notes (Addendum)
ANTICOAGULATION CONSULT NOTE - Follow-Up Consult  Pharmacy Consult for heparin Indication: new onset atrial fibrillation  Allergies  Allergen Reactions  . Codeine Phosphate [Codeine] Shortness Of Breath  . Sulfa Antibiotics Shortness Of Breath  . Asa [Aspirin] Other (See Comments)    MILD ANEMIA    Patient Measurements: Height: 5' 8.5" (174 cm) Weight: 136 lb 4.8 oz (61.8 kg) IBW/kg (Calculated) : 69.55  Heparin Dosing Weight: 65.8 kg  Vital Signs: Temp: 97.8 F (36.6 C) (08/09 0457) Temp Source: Oral (08/09 0457) BP: 141/77 (08/09 0457) Pulse Rate: 82 (08/09 0457)  Labs: Recent Labs    11/27/18 1241 11/28/18 0335 11/28/18 1139 11/29/18 0552  HGB 9.6* 10.5*  --  10.3*  HCT 28.2* 30.0*  --  29.5*  PLT 316 327  --  343  HEPARINUNFRC  --  0.33 0.27* 0.46  CREATININE 0.92 0.82  --   --     Estimated Creatinine Clearance: 51.3 mL/min (by C-G formula based on SCr of 0.82 mg/dL).   Medical History: Past Medical History:  Diagnosis Date  . Anemia    iron defiency  . Anxiety   . Coronary artery disease involving left main coronary artery 10/16/2018  . Hearing loss    bilateral - no hearing aids  . History of colon cancer   . Hyperlipidemia    MIXED  . Hypertension   . Osteopenia   . PAD (peripheral artery disease) ([place])   . Pulmonary nodule   . 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 Severe aortic stenosis   . Thoracic aortic ectasia ([place])   . Uses walker   . Vitamin D deficiency     Assessment: 85 yo M admitted for syncope, found to have new-onset Afib. Pt is s/pP TVAR on 7/28.  On aspirin and Plavix PTA, which are currently held inpatient while awaiting decision regarding pacemaker. Pharmacy consulted to dose heparin. Plans to transition to apixaban at discharge per H&P.  Heparin level 0.46 is therapeutic on 950 units/hr. CBC low but stable,  Hgb 10.3, Plt wnl 343. No bleeding noted. Heparin D/C'd by Dr. Marlou Porch. Will transition patient to apixaban.   Goal of Therapy:  Heparin level 0.3-0.7 units/ml Monitor platelets by anticoagulation protocol: Yes   Plan:  D/C heparin infusion and D/C Pharmacy consult Transition patient to apixaban. Continue to monitor signs/symptoms of bleeding  Thank you for letting pharmacy be a part of this patient's care.  Sherren Kerns, PharmD PGY1 Acute Care Pharmacy Resident [phone] 11/29/2018 10:21 AM  **Pharmacist phone directory can now be found on [email or web address] ([email or web address]).  Listed under [place].

## 2018-11-29 NOTE — Discharge Instructions (Signed)
Information on my medicine - ELIQUIS (apixaban)  This medication education was reviewed with me or my healthcare representative as part of my discharge preparation.  The pharmacist that spoke with me during my hospital stay was:  Werner Lean, [facility]  Why was Eliquis prescribed for you? Eliquis was prescribed for you to reduce the risk of a blood clot forming that can cause a stroke if you have a medical condition called atrial fibrillation (a type of irregular heartbeat).  What do You need to know about Eliquis ? Take your Eliquis TWICE DAILY - one tablet in the morning and one tablet in the evening with or without food. If you have difficulty swallowing the tablet whole please discuss with your pharmacist how to take the medication safely.  Take Eliquis exactly as prescribed by your doctor and DO NOT stop taking Eliquis without talking to the doctor who prescribed the medication.  Stopping may increase your risk of developing a stroke.  Refill your prescription before you run out.  After discharge, you should have regular check-up appointments with your healthcare provider that is prescribing your Eliquis.  In the future your dose may need to be changed if your kidney function or weight changes by a significant amount or as you get older.  What do you do if you miss a dose? If you miss a dose, take it as soon as you remember on the same day and resume taking twice daily.  Do not take more than one dose of ELIQUIS at the same time to make up a missed dose.  Important Safety Information A possible side effect of Eliquis is bleeding. You should call your healthcare provider right away if you experience any of the following: ? Bleeding from an injury or your nose that does not stop. ? Unusual colored urine (red or dark brown) or unusual colored stools (red or black). ? Unusual bruising for unknown reasons. ? A serious fall or if you hit your head (even if there is no bleeding).  Some  medicines may interact with Eliquis and might increase your risk of bleeding or clotting while on Eliquis. To help avoid this, consult your healthcare provider or pharmacist prior to using any new prescription or non-prescription medications, including herbals, vitamins, non-steroidal anti-inflammatory drugs (NSAIDs) and supplements.  This website has more information on Eliquis (apixaban): http://www.eliquis.com/eliquis/home  We also discussed to sit on the bed for 15-30 seconds before getting up from bed to prevent dizziness and falling.

## 2018-11-29 NOTE — Progress Notes (Signed)
Progress Note  Patient Name: Stephen Wolf Date of Encounter: 11/29/2018  Primary Cardiologist: No primary care provider on file.  Dr. Claudie Leach  Subjective   Yesterday had an episode where after having a bowel movement got up to go back to the bed and he became rigid staring, drooling, did not lose consciousness but his son stated that this was similar to the episode he had the day prior.  I consulted neurology, appreciate their assistance, orthostatic blood pressures were taken yesterday and were very significant with his blood pressure dropping to the 22G systolic upon standing.  I discontinued his amlodipine and Cozaar.  This morning he states that he is feeling a little bit better.  Inpatient Medications    Scheduled Meds:  atorvastatin  10 mg Oral QHS   docusate sodium  100 mg Oral BID   finasteride  5 mg Oral QHS   multivitamin with minerals  1 tablet Oral QHS   omega-3 acid ethyl esters  1 g Oral Daily   pantoprazole  40 mg Oral Daily   Continuous Infusions:  heparin 1,050 Units/hr (11/28/18 1631)   PRN Meds: acetaminophen, ondansetron (ZOFRAN) IV   Vital Signs    Vitals:   11/28/18 1330 11/28/18 2010 11/29/18 0106 11/29/18 0457  BP: 121/81 109/65  (!) 141/77  Pulse:  78  82  Resp: 18 16  18   Temp:  (!) 97.5 F (36.4 C)  97.8 F (36.6 C)  TempSrc:  Oral  Oral  SpO2: 100% 100% 100% 98%  Weight:   61.8 kg   Height:        Intake/Output Summary (Last 24 hours) at 11/29/2018 1007 Last data filed at 11/28/2018 1800 Gross per 24 hour  Intake 240 ml  Output 600 ml  Net -360 ml   Last 3 Weights 11/29/2018 11/28/2018 11/27/2018  Weight (lbs) 136 lb 4.8 oz 136 lb 11 oz 134 lb 4.8 oz  Weight (kg) 61.825 kg 62 kg 60.918 kg      Telemetry    Atrial fibrillation, good rate control, transient heart rates in the upper 40s for a few beats at night, no pauses.  Overall heart rate currently in the 70s to 60s.- Personally Reviewed  ECG    Atrial fibrillation heart  rate 77 bpm- Personally Reviewed  Physical Exam   GEN: No acute distress.  Elderly laying comfortably flat in bed Neck: No JVD Cardiac:  Irregularly irregular, soft systolic murmur, rubs, or gallops.  Respiratory: Clear to auscultation bilaterally. GI: Soft, nontender, non-distended  MS: No edema; No deformity. Neuro:  Nonfocal  Psych: Normal affect   Labs    High Sensitivity Troponin:  No results for input(s): TROPONINIHS in the last 720 hours.    Cardiac EnzymesNo results for input(s): TROPONINI in the last 168 hours. No results for input(s): TROPIPOC in the last 168 hours.   Chemistry Recent Labs  Lab 11/25/18 1616 11/27/18 1241 11/28/18 0335  NA 127* 127* 131*  K 4.9 4.4 4.0  CL 90* 92* 95*  CO2 22 25 25   GLUCOSE 98 119* 111*  BUN 14 13 9   CREATININE 0.90 0.92 0.82  CALCIUM 9.4 8.8* 8.9  GFRNONAA 74 >60 >60  GFRAA 86 >60 >60  ANIONGAP  --  10 11     Hematology Recent Labs  Lab 11/27/18 1241 11/28/18 0335 11/29/18 0552  WBC 6.1 6.6 8.9  RBC 2.85* 3.10* 3.06*  HGB 9.6* 10.5* 10.3*  HCT 28.2* 30.0* 29.5*  MCV 98.9 96.8  96.4  MCH 33.7 33.9 33.7  MCHC 34.0 35.0 34.9  RDW 12.5 12.6 12.7  PLT 316 327 343    BNPNo results for input(s): BNP, PROBNP in the last 168 hours.   DDimer No results for input(s): DDIMER in the last 168 hours.   Radiology    Ct Head Wo Contrast  Result Date: 11/27/2018 CLINICAL DATA:  Syncopal episode. EXAM: CT HEAD WITHOUT CONTRAST TECHNIQUE: Contiguous axial images were obtained from the base of the skull through the vertex without intravenous contrast. COMPARISON:  02/25/2017 FINDINGS: Brain: Age related atrophy. Chronic small-vessel ischemic changes of the cerebral hemispheric white matter. No sign of acute infarction, mass lesion, hemorrhage, hydrocephalus or extra-axial collection. Vascular: There is atherosclerotic calcification of the major vessels at the base of the brain. Skull: Negative Sinuses/Orbits: Clear/normal Other:  None IMPRESSION: Atrophy and chronic small-vessel ischemic changes of the cerebral hemispheric white matter. No acute finding. Electronically Signed   By: Nelson Chimes M.D.   On: 11/27/2018 14:43   Dg Chest Port 1 View  Result Date: 11/27/2018 CLINICAL DATA:  Syncope EXAM: PORTABLE CHEST 1 VIEW COMPARISON:  Portable exam 1445 hours compared to 11/17/2018 FINDINGS: Normal heart size post TAVR. Mediastinal contours and pulmonary vascularity normal. Atherosclerotic calcification aorta. Lungs clear. No pulmonary infiltrate, pleural effusion or pneumothorax. Osseous demineralization with minimal scattered endplate spur formation. IMPRESSION: No acute abnormalities. Electronically Signed   By: Lavonia Dana M.D.   On: 11/27/2018 15:04   US Scrotum W/doppler  Result Date: 11/27/2018 CLINICAL DATA:  BILATERAL testicular pain for 1-2 years EXAM: SCROTAL ULTRASOUND DOPPLER ULTRASOUND OF THE TESTICLES TECHNIQUE: Complete ultrasound examination of the testicles, epididymis, and other scrotal structures was performed. Color and spectral Doppler ultrasound were also utilized to evaluate blood flow to the testicles. COMPARISON:  None FINDINGS: Right testicle Measurements: 3.7 x 2.0 x 2.8 cm. Normal echogenicity without mass or calcification. Internal blood flow present on color Doppler imaging. Left testicle Measurements: 3.0 x 2.5 x 1.5 cm. Normal echogenicity. Focus of tubular ectasia of the rete testis is identified. No additional testicular mass or calcification. Internal blood flow present on color Doppler imaging, symmetric with RIGHT. Right epididymis:  Normal in size and appearance. Left epididymis:  Small cyst within LEFT epididymis 5 x 6 x 5 mm. Hydrocele:  Small BILATERAL hydroceles Varicocele:  None visualized. Pulsed Doppler interrogation of both testes demonstrates normal low resistance arterial and venous waveforms bilaterally. IMPRESSION: Small focus of tubular ectasia of the LEFT rete testis. Small LEFT  epididymal cyst 6 mm greatest size. Otherwise normal exam. Electronically Signed   By: Lavonia Dana M.D.   On: 11/27/2018 14:42    Cardiac Studies  Cardiac catheterization 03-Nov-2018- ostial occlusion RCA with left-to-right collaterals, moderate stenosis of left main, moderate stenosis of proximal circumflex nonobstructive LAD.  ECHO July 2020  1. The left ventricle has hyperdynamic systolic function, with an ejection fraction of >65%. The cavity size was normal. Left ventricular diastolic Doppler parameters are consistent with impaired relaxation.  2. The right ventricle has normal systolic function. The cavity was normal. There is no increase in right ventricular wall thickness. Right ventricular systolic pressure Cannot assess as IVC not well visualized.  3. Left atrial size was mildly dilated.  4. There is moderate mitral annular calcification present and trivial mitral regurgitation.  5. There is trivial tricuspid regurgitation.  6. The aorta is normal in size and structure.  7. - TAVR: 26 mm Sapien 3 valve that appears to be functioning  normally. There is no perivalvular AI. AV Mean Grad:8.0 mmHg. AV Vmax:214.00 cm/s. AV Area (Vmax):2.04 cm. LVOT/AV VTI ratio:0.55.  8. Compared to prior echo there is now a TAVR.  Patient Profile     85 y.o. male with severe orthostatic hypotension, near syncope, recent TAVR  Assessment & Plan    Orthostatic hypotension -Significant drop from 740 systolic down to 60 systolic upon standing. -I stopped his amlodipine as well as losartan. -Compression hose would be helpful. -This certainly could explain his symptoms that he has been experiencing over the last several months.  His son did state that they have checked his blood pressures like this in the past and have experienced a 30 point drop but did not alter blood pressure medications at that time.  Perhaps it was not as significant or dramatic as we saw here in the hospital. -Thankfully, his telemetry  does not show any significant pauses or significant bradycardia that is sustained that would explain his symptoms.  Blood pressure does explain it however. -Recheck this afternoon. -A.m. cortisol was, normal.  -I explained to him that if his blood pressures become significantly elevated even in the orthostatic position, we may need to gently add back some of his antihypertensives in the future.  I would recommend that he check his blood pressures laying and standing for the next several days while his body is calibrating.  My hope is that if we can allow for a higher laying down or sitting blood pressure that we will not see quite as dramatic a decrease in his blood pressure when standing which clearly was leading to his symptomatology.  TAVR -Doing well.  At first, we were worried about potential need for pacemaker given recent TAVR and near syncope.  However, it appears that he has severe orthostatic hypotension and does not warrant pacemaker.  New onset atrial fibrillation - We will go ahead and stop his IV heparin since he is not going to need a procedure.  I will place him on Eliquis 2.5 mg twice a day given his age of 24 and his weight very close to 60 kg (weight was 60.9 kg on 11/27/2018).  With his potential fall risk (has fallen in the past,) I think this makes sense. Explained to he and his son the potential stroke risk. -After 3 weeks of anticoagulation, if he is still in atrial fibrillation, it would not be unreasonable to proceed with cardioversion.  Potential discharge this afternoon. He may continue to follow-up with his cardiologist.   For questions or updates, please contact [place] Please consult [email or web address] for contact info under        Signed, Candee Furbish, MD  11/29/2018, 10:07 AM

## 2018-11-30 DIAGNOSIS — I951 Orthostatic hypotension: Principal | ICD-10-CM

## 2018-11-30 MED ORDER — APIXABAN 2.5 MG PO TABS: 2.5000 mg | ORAL_TABLET | Freq: Two times a day (BID) | ORAL | 11 refills | Status: AC

## 2018-11-30 MED ORDER — ASPIRIN 81 MG PO CHEW
81.0000 mg | CHEWABLE_TABLET | Freq: Every day | ORAL | Status: DC
  Administered 2018-11-30: 81 mg via ORAL
  Filled 2018-11-30: qty 1

## 2018-11-30 MED ORDER — APIXABAN 2.5 MG PO TABS: 2.5000 mg | ORAL_TABLET | Freq: Two times a day (BID) | ORAL | 0 refills | Status: DC

## 2018-11-30 MED ORDER — APIXABAN 2.5 MG PO TABS: 2.5000 mg | ORAL_TABLET | Freq: Two times a day (BID) | ORAL | 11 refills | Status: DC

## 2018-11-30 MED FILL — ELIQUIS 2.5 MG TABLET: 2.5 | 30 days supply | Qty: 60 | Fill #0

## 2018-11-30 NOTE — TOC Transition Note (Addendum)
Transition of Care [facility]) - CM/SW Discharge Note   Patient Details  Name: Stephen Wolf MRN: [id number] Date of Birth: 1925-06-18  [facility] CM/SW Contact:  Zenon Mayo, RN Phone Number: 11/30/2018, 3:04 PM   Clinical Narrative:    From home with son, he is for dc today, needing a rollator , Zack with [employer] notifed, he will bring rollator to patient room prior to discharge. Also patient PCP at the [facility] is Dr. Felton Clinton fax number is [phone].  NCM will fax the dc summary to him. Patient has been started on eliquis.  NCM informed rep to let PCP office know patient will need follow up apt and tha he was started on eliquis, they will contact patient to set up . NCM faxed dc summary to PCP office.   Final next level of care: Home/Self Care Barriers to Discharge: No Barriers Identified   Patient Goals and CMS Choice Patient states their goals for this hospitalization and ongoing recovery are:: get better      Discharge Placement                       Discharge Plan and Services                DME Arranged: Walker rolling with seat DME Agency: [employer] Date DME Agency Contacted: 11/30/18 Time DME Agency Contacted: [phone] Representative spoke with at DME Agency: zack HH Arranged: NA          Social Determinants of Health ([place]) Interventions     Readmission Risk Interventions No flowsheet data found.

## 2018-11-30 NOTE — Plan of Care (Signed)
  Problem: Nutrition: Goal: Adequate nutrition will be maintained Outcome: Completed/Met   Problem: Coping: Goal: Level of anxiety will decrease Outcome: Completed/Met   Problem: Elimination: Goal: Will not experience complications related to bowel motility Outcome: Completed/Met Goal: Will not experience complications related to urinary retention Outcome: Completed/Met   Problem: Pain Managment: Goal: General experience of comfort will improve Outcome: Completed/Met   Problem: Skin Integrity: Goal: Risk for impaired skin integrity will decrease Outcome: Completed/Met

## 2018-11-30 NOTE — Progress Notes (Signed)
Discharge instruction and medication information was given to pt hay his son. PT is waiting for his rollator and he will be leaving with his son when rollator is delivered to him.

## 2018-11-30 NOTE — Discharge Summary (Addendum)
Discharge Summary    Patient ID: Stephen Wolf,  MRN: [id number], DOB/AGE: Dec 07, 1925 85 y.o.  Admit date: 11/27/2018 Discharge date: 11/30/2018  Primary Care Provider: Secundino Ginger Primary Cardiologist: Franchot Mimes, MD   Discharge Diagnoses    Active Problems:   Atrial fibrillation (HCC)   Orthostatic hypotension   Allergies Allergies  Allergen Reactions   Codeine Phosphate [Codeine] Shortness Of Breath   Sulfa Antibiotics Shortness Of Breath   Asa [Aspirin] Other (See Comments)    MILD ANEMIA    Diagnostic Studies/Procedures    DOPPLER ULTRASOUND OF THE TESTICLES  TECHNIQUE: Complete ultrasound examination of the testicles, epididymis, and other scrotal structures was performed. Color and spectral Doppler ultrasound were also utilized to evaluate blood flow to the testicles.  COMPARISON:  None  FINDINGS: Right testicle  Measurements: 3.7 x 2.0 x 2.8 cm. Normal echogenicity without mass or calcification. Internal blood flow present on color Doppler imaging.  Left testicle  Measurements: 3.0 x 2.5 x 1.5 cm. Normal echogenicity. Focus of tubular ectasia of the rete testis is identified. No additional testicular mass or calcification. Internal blood flow present on color Doppler imaging, symmetric with RIGHT.  Right epididymis:  Normal in size and appearance.  Left epididymis:  Small cyst within LEFT epididymis 5 x 6 x 5 mm.  Hydrocele:  Small BILATERAL hydroceles  Varicocele:  None visualized.  Pulsed Doppler interrogation of both testes demonstrates normal low resistance arterial and venous waveforms bilaterally.  IMPRESSION: Small focus of tubular ectasia of the LEFT rete testis.  Small LEFT epididymal cyst 6 mm greatest size.  Otherwise normal exam.  ECHO: 11/18/2018  1. The left ventricle has hyperdynamic systolic function, with an ejection fraction of >65%. The cavity size was normal. Left ventricular  diastolic Doppler parameters are consistent with impaired relaxation.  2. The right ventricle has normal systolic function. The cavity was normal. There is no increase in right ventricular wall thickness. Right ventricular systolic pressure Cannot assess as IVC not well visualized.  3. Left atrial size was mildly dilated.  4. There is moderate mitral annular calcification present and trivial mitral regurgitation.  5. There is trivial tricuspid regurgitation.  6. The aorta is normal in size and structure.  7. - TAVR: 26 mm Sapien 3 valve that appears to be functioning normally. There is no perivalvular AI. AV Mean Grad:8.0 mmHg. AV Vmax:214.00 cm/s. AV Area (Vmax):2.04 cm. LVOT/AV VTI ratio:0.55.  8. Compared to prior echo there is now a TAVR. _____________   History of Present Illness     85 y.o. male with hx of recent TAVR 11/17/18, HTN, PAD, HLD, IDA, multivessel CAD and rheumatic fever presented 11/27/2018 for syncope evaluation.   Hospital Course     Consultants: Neurology  Stephen Wolf had gotten weak when he was out for a walk with his Wolf.  He did not fall, but was assisted down to the driveway pavement by his Wolf.  He knew he was too weak to stand any longer.  He was found to be in atrial fibrillation.  1.  Syncope: Stephen Wolf was significantly orthostatic with a systolic blood pressure they went from 148>> 60 going from lying to standing.  His blood pressure medications were discontinued.  He was hydrated.  His blood pressure gradually elevated and his symptoms improved.    Currently, his systolic blood pressure was as high as 173.  However, his systolic blood pressure only dropped to 135 with standing and he is  able to ambulate.  For now, continue to hold all blood pressure medications and follow-up with Dr. Claudie Leach as soon as possible.  2.  Unusual, possibly neurologic spell: He had been assisted by the staff to the bathroom and had been straining to have a bowel movement.  When he  stood back up, he became stiff, got a blank stare and was drooling.  The symptoms were brief and he was assisted back to bed.  The monitor did not show any significant abnormalities during the spell.  He was seen by neurology, more likely to be from orthostasis, rather than a seizure.  His EEG was negative.  Conservative treatment was recommended.  3.  New onset atrial fibrillation: He was started on Eliquis 2.5 mg twice daily.  The dose was reduced given his age, weight approximately 60 kg and increased fall risk.  The potential stroke risk was explained to the patient and he is agreeable to this plan.  Consideration can be given to cardioversion if he is still in atrial fibrillation after 3 weeks.  4.  History of TAVR on 11/17/2018: Postprocedure echo results are above.  He had no indication of volume overload or any abnormality with the TAVR, he is to follow-up as previously scheduled.  5.  Recent UTI: He had recently been treated for a urinary tract infection with Cipro.  He was having some testicular soreness and was noted to have some nonspecific spots on his bladder for which he was to follow-up with urology.  An ultrasound was performed, results above.  He can follow-up with urology as an outpatient.  On 11/30/2018, he was seen by Dr. Marlou Porch and all data were reviewed.  His orthostatic symptoms and blood pressures had improved.  He had been hydrated successfully.  He was otherwise doing well and considered stable for discharge, to stay off all blood pressure medications for now.  Spoke to Stephen Wolf on the phone, advised him ok to give Stephen 1/2 of Losartan 50 mg if SBP > 170. Answered all questions thoroughly.  _____________  Discharge Vitals Blood pressure 139/68, pulse 64, temperature 98.2 F (36.8 C), temperature source Oral, resp. rate 18, height 5' 8.5" (1.74 m), weight 62.4 kg, SpO2 100 %.  Filed Weights   11/28/18 0618 11/29/18 0106 11/30/18 0646  Weight: 62 kg 61.8 kg 62.4 kg    Labs &  Radiologic Studies    CBC Recent Labs    11/28/18 0335 11/29/18 0552  WBC 6.6 8.9  HGB 10.5* 10.3*  HCT 30.0* 29.5*  MCV 96.8 96.4  PLT 327 540   Basic Metabolic Panel Recent Labs    11/28/18 0335  NA 131*  K 4.0  CL 95*  CO2 25  GLUCOSE 111*  BUN 9  CREATININE 0.82  CALCIUM 8.9   Lab Results  Component Value Date   TSH 2.530 11/25/2018   ____________  Dg Chest 2 View  Result Date: 11/13/2018 CLINICAL DATA:  Preoperative, aortic valve replacement EXAM: CHEST - 2 VIEW COMPARISON:  10/09/2017 FINDINGS: The heart size and mediastinal contours are within normal limits. Both lungs are clear. Disc degenerative disease of the thoracic spine. IMPRESSION: No acute abnormality of the lungs. Electronically Signed   By: Eddie Candle M.D.   On: 11/13/2018 16:26   Ct Head Wo Contrast  Result Date: 11/27/2018 CLINICAL DATA:  Syncopal episode. EXAM: CT HEAD WITHOUT CONTRAST TECHNIQUE: Contiguous axial images were obtained from the base of the skull through the vertex without intravenous contrast. COMPARISON:  02/25/2017 FINDINGS: Brain: Age related atrophy. Chronic small-vessel ischemic changes of the cerebral hemispheric white matter. No sign of acute infarction, mass lesion, hemorrhage, hydrocephalus or extra-axial collection. Vascular: There is atherosclerotic calcification of the major vessels at the base of the brain. Skull: Negative Sinuses/Orbits: Clear/normal Other: None IMPRESSION: Atrophy and chronic small-vessel ischemic changes of the cerebral hemispheric white matter. No acute finding. Electronically Signed   By: Nelson Chimes M.D.   On: 11/27/2018 14:43   Dg Chest Port 1 View  Result Date: 11/27/2018 CLINICAL DATA:  Syncope EXAM: PORTABLE CHEST 1 VIEW COMPARISON:  Portable exam 1445 hours compared to 11/17/2018 FINDINGS: Normal heart size post TAVR. Mediastinal contours and pulmonary vascularity normal. Atherosclerotic calcification aorta. Lungs clear. No pulmonary infiltrate,  pleural effusion or pneumothorax. Osseous demineralization with minimal scattered endplate spur formation. IMPRESSION: No acute abnormalities. Electronically Signed   By: Lavonia Dana M.D.   On: 11/27/2018 15:04   Dg Chest Port 1 View  Result Date: 11/17/2018 CLINICAL DATA:  Status post TAVR EXAM: PORTABLE CHEST 1 VIEW COMPARISON:  November 13, 2018 FINDINGS: The lungs are clear. No pneumothorax or focal airspace consolidation. No pleural effusion. The patient is status post TAVR with stent in place. There is atherosclerotic calcification the aortic knob with a tortuous descending aorta. the cardiomediastinal silhouette is otherwise unremarkable. IMPRESSION: Status post TAVR.  No acute cardiopulmonary process Electronically Signed   By: Prudencio Pair M.D.   On: 11/17/2018 17:56   US Scrotum W/doppler  Result Date: 11/27/2018 CLINICAL DATA:  BILATERAL testicular pain for 1-2 years EXAM: SCROTAL ULTRASOUND DOPPLER ULTRASOUND OF THE TESTICLES TECHNIQUE: Complete ultrasound examination of the testicles, epididymis, and other scrotal structures was performed. Color and spectral Doppler ultrasound were also utilized to evaluate blood flow to the testicles. COMPARISON:  None FINDINGS: Right testicle Measurements: 3.7 x 2.0 x 2.8 cm. Normal echogenicity without mass or calcification. Internal blood flow present on color Doppler imaging. Left testicle Measurements: 3.0 x 2.5 x 1.5 cm. Normal echogenicity. Focus of tubular ectasia of the rete testis is identified. No additional testicular mass or calcification. Internal blood flow present on color Doppler imaging, symmetric with RIGHT. Right epididymis:  Normal in size and appearance. Left epididymis:  Small cyst within LEFT epididymis 5 x 6 x 5 mm. Hydrocele:  Small BILATERAL hydroceles Varicocele:  None visualized. Pulsed Doppler interrogation of both testes demonstrates normal low resistance arterial and venous waveforms bilaterally. IMPRESSION: Small focus of tubular  ectasia of the LEFT rete testis. Small LEFT epididymal cyst 6 mm greatest size. Otherwise normal exam. Electronically Signed   By: Lavonia Dana M.D.   On: 11/27/2018 14:42   Hybrid Or Imaging (mc Only)  Result Date: 11/17/2018 There is no interpretation for this exam.  This order is for images obtained during a surgical procedure.  Please See "Surgeries" Tab for more information regarding the procedure.   Disposition   Stephen is being discharged home today in good condition.  Follow-up Plans & Appointments    Follow-up Information    Eileen Stanford, PA-C. Go on 12/16/2018.   Specialties: Cardiology, Radiology Why: @ 1:15 pm for an echocardiogram followed by an in office visit with Donzetta Matters information: [address] [place] [id number] [phone]        Franchot Mimes, MD. Schedule an appointment as soon as possible for a visit in 2 week(s).   Specialty: Family Medicine Contact information: [place] [facility] [place]  [phone]          Discharge Instructions    Diet - low sodium heart healthy   Complete by: As directed    Increase activity slowly   Complete by: As directed       Discharge Medications   Allergies as of 11/30/2018      Reactions   Codeine Phosphate [codeine] Shortness Of Breath   Sulfa Antibiotics Shortness Of Breath   Asa [aspirin] Other (See Comments)   MILD ANEMIA      Medication List    STOP taking these medications   amLODipine 5 MG tablet Commonly known as: NORVASC   clopidogrel 75 MG tablet Commonly known as: PLAVIX   losartan 50 MG tablet Commonly known as: COZAAR     TAKE these medications   ALPRAZolam 0.25 MG tablet Commonly known as: XANAX Take 0.25 mg by mouth at bedtime.   amoxicillin 500 MG tablet Commonly known as: AMOXIL Take 4 tablets (2,000 mg total) by mouth as directed. Take 1 hour prior to dental work, including routine cleanings.     apixaban 2.5 MG Tabs tablet Commonly known as: ELIQUIS Take 1 tablet (2.5 mg total) by mouth 2 (two) times daily.   aspirin 81 MG EC tablet Take 1 tablet (81 mg total) by mouth daily.   docusate sodium 100 MG capsule Commonly known as: COLACE Take 100 mg by mouth 2 (two) times daily.   finasteride 5 MG tablet Commonly known as: PROSCAR Take 5 mg by mouth at bedtime.   Fish Oil 1000 MG Caps Take 1,000 mg by mouth at bedtime.   FUSION PLUS PO Take 1 capsule by mouth at bedtime.   multivitamin tablet Take 1 tablet by mouth at bedtime.   pantoprazole 40 MG tablet Commonly known as: PROTONIX Take 1 tablet (40 mg total) by mouth daily.   simvastatin 20 MG tablet Commonly known as: ZOCOR Take 20 mg by mouth at bedtime.   VITAMIN D (CHOLECALCIFEROL) PO Take 1 tablet by mouth at bedtime.           Outstanding Labs/Studies   None  Duration of Discharge Encounter   Greater than 30 minutes including physician time.  Alcario Drought Barrett NP 11/30/2018, 2:48 PM  Personally seen and examined. Agree with above.   Primary Cardiologist:   Dr. Claudie Leach  Subjective   Yesterday, after approximately 24 hours of holding his amlodipine and losartan, his blood pressure was still quite orthostatic dropping into the 75Z systolic with symptoms.  We gently gave him fluids 75 cc an hour.  This is now approximately 48 hours post blood pressure medication hold.  Inpatient Medications    Scheduled Meds:  apixaban  2.5 mg Oral BID   atorvastatin  10 mg Oral QHS   docusate sodium  100 mg Oral BID   finasteride  5 mg Oral QHS   multivitamin with minerals  1 tablet Oral QHS   omega-3 acid ethyl esters  1 g Oral Daily   pantoprazole  40 mg Oral Daily   Continuous Infusions:  sodium chloride 75 mL/hr at 11/29/18 1808   PRN Meds: acetaminophen, ondansetron (ZOFRAN) IV   Vital Signs          Vitals:   11/29/18 1114 11/29/18 1421 11/29/18 2108 11/30/18 0646   BP: 128/60 (!) 100/58 (!) 104/58 (!) 148/86  Pulse: 83 90 80 71  Resp: 18  18 18   Temp: 98 F (36.7 C) 98 F (36.7 C) 97.9 F (36.6  C) 98.4 F (36.9 C)  TempSrc: Oral Oral Oral Oral  SpO2: 99% 99% 100% 100%  Weight:    62.4 kg  Height:        Intake/Output Summary (Last 24 hours) at 11/30/2018 0920 Last data filed at 11/30/2018 0757    Gross per 24 hour  Intake 1417.97 ml  Output 926 ml  Net 491.97 ml   Last 3 Weights 11/30/2018 11/29/2018 11/28/2018  Weight (lbs) 137 lb 8 oz 136 lb 4.8 oz 136 lb 11 oz  Weight (kg) 62.37 kg 61.825 kg 62 kg      Telemetry    Atrial fibrillation rate controlled, no pauses- Personally Reviewed  ECG    Atrial fibrillation- Personally Reviewed  Physical Exam   GEN:No acute distress.  Elderly Neck:No JVD Cardiac: Irregularly irregular, soft systolic murmur, no rubs, or gallops.  Respiratory:Clear to auscultation bilaterally. QA:STMH, nontender, non-distended  MS:No edema; No deformity. Neuro:Nonfocal  Psych: Normal affect   Labs    High Sensitivity Troponin:   Last Labs   No results for input(s): TROPONINIHS in the last 720 hours.      Cardiac Enzymes Last Labs   No results for input(s): TROPONINI in the last 168 hours.    Last Labs   No results for input(s): TROPIPOC in the last 168 hours.     Chemistry Last Labs        Recent Labs  Lab 11/25/18 1616 11/27/18 1241 11/28/18 0335  NA 127* 127* 131*  K 4.9 4.4 4.0  CL 90* 92* 95*  CO2 22 25 25   GLUCOSE 98 119* 111*  BUN 14 13 9   CREATININE 0.90 0.92 0.82  CALCIUM 9.4 8.8* 8.9  GFRNONAA 74 >60 >60  GFRAA 86 >60 >60  ANIONGAP  --  10 11       Hematology Last Labs        Recent Labs  Lab 11/27/18 1241 11/28/18 0335 11/29/18 0552  WBC 6.1 6.6 8.9  RBC 2.85* 3.10* 3.06*  HGB 9.6* 10.5* 10.3*  HCT 28.2* 30.0* 29.5*  MCV 98.9 96.8 96.4  MCH 33.7 33.9 33.7  MCHC 34.0 35.0 34.9  RDW 12.5 12.6 12.7  PLT 316 327 343      BNP Last  Labs   No results for input(s): BNP, PROBNP in the last 168 hours.     DDimer  Last Labs   No results for input(s): DDIMER in the last 168 hours.     Radiology    Imaging Results (Last 48 hours)  No results found.    Cardiac Studies   Cardiac catheterization 09/2018-ostial occlusion RCA with left-to-right collaterals, moderate stenosis of left main, moderate stenosis of proximal circumflex nonobstructive LAD.  ECHO July 2020 1. The left ventricle has hyperdynamic systolic function, with an ejection fraction of >65%. The cavity size was normal. Left ventricular diastolic Doppler parameters are consistent with impaired relaxation. 2. The right ventricle has normal systolic function. The cavity was normal. There is no increase in right ventricular wall thickness. Right ventricular systolic pressure Cannot assess as IVC not well visualized. 3. Left atrial size was mildly dilated. 4. There is moderate mitral annular calcification present and trivial mitral regurgitation. 5. There is trivial tricuspid regurgitation. 6. The aorta is normal in size and structure. 7. - TAVR: 26 mm Sapien 3 valve that appears to be functioning normally. There is no perivalvular AI. AV Mean Grad:8.0 mmHg. AV Vmax:214.00 cm/s. AV Area (Vmax):2.04 cm. LVOT/AV VTI ratio:0.55. 8. Compared to  prior echo there is now a TAVR.   Patient Profile     85 y.o. male with severe orthostatic hypotension resulting in near syncope/syncope  Assessment & Plan    Orthostatic hypotension - Hydration 75 mL/h overnight - We have held his amlodipine and losartan for approximately 48 hours. -Compression hose may be beneficial. - We were able to duplicate his symptoms in the hospital with positive orthostasis.  This is likely what was happening at home.  I do not see any evidence of significant pauses or significant bradycardia surrounding the symptoms.  He does not need a pacemaker post TAVR. - A.m. cortisol  was also reassuring, negates adrenal insufficiency. -Given the significant orthostatic hypotension, we are willing to tolerate a higher resting blood pressure either laying or sitting.  I have explained this to his Wolf at length.  New onset atrial fibrillation persistent -Post TAVR, I decided to utilize Eliquis 2.5 mg twice a day given his age of 30, weight which was approximately 60 kg, fall risk given his severe orthostatic hypotension and recent fall episode.  He understands potential stroke risk. -After 3 weeks of anticoagulation, if he is still in atrial fibrillation, it would not be unreasonable to attempt cardioversion.  TAVR -Doing well.  Echo as above.  If orthostatics are improved from a symptom standpoint, will be more comfortable with him going home.  For questions or updates, please contact [place] Please consult [email or web address] for contact info under        Signed, Candee Furbish, MD  11/30/2018, 9:20 AM    ADDENDUM  Orthostatics were much improved.  When he goes home, no amlodipine or losartan. Hydrate well. TED hose.   Eliquis [facility]. Case mgt. Working on this.  OK for DC  Candee Furbish, MD

## 2018-11-30 NOTE — Progress Notes (Addendum)
Progress Note  Patient Name: Stephen Wolf Date of Encounter: 11/30/2018  Primary Cardiologist:   Dr. Claudie Leach  Subjective   Yesterday, after approximately 24 hours of holding his amlodipine and losartan, his blood pressure was still quite orthostatic dropping into the 62H systolic with symptoms.  We gently gave him fluids 75 cc an hour.  This is now approximately 48 hours post blood pressure medication hold.  Inpatient Medications    Scheduled Meds:  apixaban  2.5 mg Oral BID   atorvastatin  10 mg Oral QHS   docusate sodium  100 mg Oral BID   finasteride  5 mg Oral QHS   multivitamin with minerals  1 tablet Oral QHS   omega-3 acid ethyl esters  1 g Oral Daily   pantoprazole  40 mg Oral Daily   Continuous Infusions:  sodium chloride 75 mL/hr at 11/29/18 1808   PRN Meds: acetaminophen, ondansetron (ZOFRAN) IV   Vital Signs    Vitals:   11/29/18 1114 11/29/18 1421 11/29/18 2108 11/30/18 0646  BP: 128/60 (!) 100/58 (!) 104/58 (!) 148/86  Pulse: 83 90 80 71  Resp: 18  18 18   Temp: 98 F (36.7 C) 98 F (36.7 C) 97.9 F (36.6 C) 98.4 F (36.9 C)  TempSrc: Oral Oral Oral Oral  SpO2: 99% 99% 100% 100%  Weight:    62.4 kg  Height:        Intake/Output Summary (Last 24 hours) at 11/30/2018 0920 Last data filed at 11/30/2018 0757 Gross per 24 hour  Intake 1417.97 ml  Output 926 ml  Net 491.97 ml   Last 3 Weights 11/30/2018 11/29/2018 11/28/2018  Weight (lbs) 137 lb 8 oz 136 lb 4.8 oz 136 lb 11 oz  Weight (kg) 62.37 kg 61.825 kg 62 kg      Telemetry    Atrial fibrillation rate controlled, no pauses- Personally Reviewed  ECG    Atrial fibrillation- Personally Reviewed  Physical Exam   GEN: No acute distress.  Elderly Neck: No JVD Cardiac:  Irregularly irregular, soft systolic murmur, no rubs, or gallops.  Respiratory: Clear to auscultation bilaterally. GI: Soft, nontender, non-distended  MS: No edema; No deformity. Neuro:  Nonfocal  Psych: Normal  affect   Labs    High Sensitivity Troponin:  No results for input(s): TROPONINIHS in the last 720 hours.    Cardiac EnzymesNo results for input(s): TROPONINI in the last 168 hours. No results for input(s): TROPIPOC in the last 168 hours.   Chemistry Recent Labs  Lab 11/25/18 1616 11/27/18 1241 11/28/18 0335  NA 127* 127* 131*  K 4.9 4.4 4.0  CL 90* 92* 95*  CO2 22 25 25   GLUCOSE 98 119* 111*  BUN 14 13 9   CREATININE 0.90 0.92 0.82  CALCIUM 9.4 8.8* 8.9  GFRNONAA 74 >60 >60  GFRAA 86 >60 >60  ANIONGAP  --  10 11     Hematology Recent Labs  Lab 11/27/18 1241 11/28/18 0335 11/29/18 0552  WBC 6.1 6.6 8.9  RBC 2.85* 3.10* 3.06*  HGB 9.6* 10.5* 10.3*  HCT 28.2* 30.0* 29.5*  MCV 98.9 96.8 96.4  MCH 33.7 33.9 33.7  MCHC 34.0 35.0 34.9  RDW 12.5 12.6 12.7  PLT 316 327 343    BNPNo results for input(s): BNP, PROBNP in the last 168 hours.   DDimer No results for input(s): DDIMER in the last 168 hours.   Radiology    No results found.  Cardiac Studies   Cardiac catheterization 09/2018-ostial  occlusion RCA with left-to-right collaterals, moderate stenosis of left main, moderate stenosis of proximal circumflex nonobstructive LAD.  ECHO July 2020 1. The left ventricle has hyperdynamic systolic function, with an ejection fraction of >65%. The cavity size was normal. Left ventricular diastolic Doppler parameters are consistent with impaired relaxation. 2. The right ventricle has normal systolic function. The cavity was normal. There is no increase in right ventricular wall thickness. Right ventricular systolic pressure Cannot assess as IVC not well visualized. 3. Left atrial size was mildly dilated. 4. There is moderate mitral annular calcification present and trivial mitral regurgitation. 5. There is trivial tricuspid regurgitation. 6. The aorta is normal in size and structure. 7. - TAVR: 26 mm Sapien 3 valve that appears to be functioning normally. There is no  perivalvular AI. AV Mean Grad:8.0 mmHg. AV Vmax:214.00 cm/s. AV Area (Vmax):2.04 cm. LVOT/AV VTI ratio:0.55. 8. Compared to prior echo there is now a TAVR.   Patient Profile     85 y.o. male with severe orthostatic hypotension resulting in near syncope/syncope  Assessment & Plan    Orthostatic hypotension - Hydration 75 mL/h overnight - We have held his amlodipine and losartan for approximately 48 hours. -Compression hose may be beneficial. - We were able to duplicate his symptoms in the hospital with positive orthostasis.  This is likely what was happening at home.  I do not see any evidence of significant pauses or significant bradycardia surrounding the symptoms.  He does not need a pacemaker post TAVR. - A.m. cortisol was also reassuring, negates adrenal insufficiency. -Given the significant orthostatic hypotension, we are willing to tolerate a higher resting blood pressure either laying or sitting.  I have explained this to his son at length.  New onset atrial fibrillation persistent -Post TAVR, I decided to utilize Eliquis 2.5 mg twice a day given his age of 46, weight which was approximately 60 kg, fall risk given his severe orthostatic hypotension and recent fall episode.  He understands potential stroke risk. -After 3 weeks of anticoagulation, if he is still in atrial fibrillation, it would not be unreasonable to attempt cardioversion.  TAVR -Doing well.  Echo as above.  If orthostatics are improved from a symptom standpoint, will be more comfortable with him going home.  For questions or updates, please contact [place] Please consult [email or web address] for contact info under        Signed, Candee Furbish, MD  11/30/2018, 9:20 AM    ADDENDUM  Orthostatics were much improved.  When he goes home, no amlodipine or losartan. Hydrate well. TED hose.   Eliquis [facility]. Case mgt. Working on this.  OK for DC  Candee Furbish, MD

## 2018-12-04 ENCOUNTER — Telehealth: Payer: Self-pay | Admitting: Physician Assistant

## 2018-12-04 NOTE — Telephone Encounter (Signed)
  HEART AND [place] VALVE TEAM   Patient was recently admitted for orthostatic hypotension. He was taken of all BP meds. Dr Marlou Porch told him to take a half losartan when BP got high. Patient's son called about elevated BPs and wanting to know at what readings he should give him the half dose. He remains orthostatic and BP was recently 87/40 when standing. I told him to take the half losartan if BP >180/100. He voiced understanding. I also recommended the patient wear his compression stockings but patient refuses because they are so uncomfortable.  Angelena Form PA-C  [facility]

## 2018-12-09 ENCOUNTER — Other Ambulatory Visit (HOSPITAL_COMMUNITY): Payer: Medicare Other

## 2018-12-16 ENCOUNTER — Encounter: Payer: Self-pay | Admitting: Physician Assistant

## 2018-12-16 ENCOUNTER — Ambulatory Visit (HOSPITAL_COMMUNITY): Payer: Medicare Other | Attending: Cardiovascular Disease

## 2018-12-16 ENCOUNTER — Telehealth: Payer: Medicare Other | Admitting: Physician Assistant

## 2018-12-16 ENCOUNTER — Other Ambulatory Visit: Payer: Self-pay

## 2018-12-16 ENCOUNTER — Ambulatory Visit (INDEPENDENT_AMBULATORY_CARE_PROVIDER_SITE_OTHER): Payer: Medicare Other | Admitting: Physician Assistant

## 2018-12-16 VITALS — BP 152/88 | HR 75 | Ht 68.5 in | Wt 142.6 lb

## 2018-12-16 DIAGNOSIS — E871 Hypo-osmolality and hyponatremia: Secondary | ICD-10-CM

## 2018-12-16 DIAGNOSIS — I10 Essential (primary) hypertension: Secondary | ICD-10-CM | POA: Diagnosis not present

## 2018-12-16 DIAGNOSIS — Z952 Presence of prosthetic heart valve: Secondary | ICD-10-CM

## 2018-12-16 DIAGNOSIS — I4891 Unspecified atrial fibrillation: Secondary | ICD-10-CM

## 2018-12-16 DIAGNOSIS — E785 Hyperlipidemia, unspecified: Secondary | ICD-10-CM

## 2018-12-16 DIAGNOSIS — I2510 Atherosclerotic heart disease of native coronary artery without angina pectoris: Secondary | ICD-10-CM

## 2018-12-16 NOTE — Patient Instructions (Addendum)
Medication Instructions:  No changes--please stop aspirin after 05/20/19 If you need a refill on your cardiac medications before your next appointment, please call your pharmacy.   Lab work: none If you have labs (blood work) drawn today and your tests are completely normal, you will receive your results only by: [name] MyChart Message (if you have MyChart) OR . A paper copy in the mail If you have any lab test that is abnormal or we need to change your treatment, we will call you to review the results.  Testing/Procedures: none  Follow-Up: in one year with Angelena Form PA-C.  This has been scheduled for 12/08/19 at 1:30 pm   Any Other Special Instructions Will Be Listed Below (If Applicable).

## 2018-12-16 NOTE — Progress Notes (Addendum)
HEART AND [place]                                       Cardiology Office Note    Date:  12/16/2018   ID:  Stephen Wolf, DOB 05-Jun-1925, MRN [id number]  PCP:  Stephen Ginger, PA-C  Cardiologist: Dr. Claudie Leach / Dr. Burt Knack & Dr. Roxy Manns (TAVR)  CC: 1 month s/p TAVR  History of Present Illness:  Stephen Wolf is a 85 y.o. male with a history of HTN, HLD, PAD, iron deficiency anemia, multivessel CAD, rheumatic fever during childhood and severe AS s/p TAVR (11/14/18) who presents to clinic for follow up.   The patient has been followed by Stephen Cash PA-C for aortic stenosis. He was initially referred for surgical consultation about 1 year ago. At that time, he denied any symptoms and it was elected to follow the patient carefully.Over the past year the patient has developed worsening fatigue, generalized weakness, exertional shortness of breath, loss of appetite, weight loss, and occasional dizzy spells. Follow-up echocardiogram performed at [facility] Aug 31, 2018 revealed significant progression and severity of aortic stenosis with peak velocity across the aortic valve measured 3.9 m/s. Cardiac cath 6/26 showedsignificantCADincluding 100% chronic occlusion of the right coronary artery with 50-60% stenosis of the left main coronary artery with positive DFR of the left main/LCx (0.84). Plan was to treat CAD medically.  He underwent successful TAVR with a28mm Edwards Sapien UltraTHV via theleft subclavianapproach on 11/17/18. Post operative echo showed EF 65%, normally functioning TAVR with mean gradient of 8 mm Hg and no PVL. He was discharged on aspirin and plavix.   At one week follow up he complained of persistent shortness of breath and fatigue. Follow up labs showed stable anemia with Hg 10.4, hyponatremia with NA 127 and normal renal function, potassium and TSH.  The patient had a syncopal episode on 11/27/2018 and was  readmitted to the hospital from 8/7-8/01/2019 where he was diagnosed with significant orthostatic hypotension as well as new onset atrial fibrillation. His home antihypertension medications were discontinued and plan was to allow for permissive hypertension given dramatic drop in blood pressure with positional changes. During admission he had an unusual neurologic spell where he had a blank stare and drooling episode after standing up from using the bathroom.  Neurology was consulted and EEG was negative. Conservative treatment was recommended. He was discharged on Eliquis 2.5 mg twice daily and aspirin 81 mg daily.  Plavix was discontinued.  Today he presents to clinic for follow up. Stephen Wolf, his son, is present today as well. His BP has been elevated but dizzy spells improved since stopping BP meds. No CP. No LE edema, orthopnea or PND.  No blood in stool or urine. No palpitations. They are very disappointed that he continues to feel so unwell with general malaise, debilitating fatigue and dyspnea on exertion and want to know if there is anything else we can do to help him feel better.    Past Medical History:  Diagnosis Date   Anemia    iron defiency   Anxiety    Coronary artery disease involving left main coronary artery 10/16/2018   Hearing loss    bilateral - no hearing aids   History of colon cancer    Hyperlipidemia    MIXED   Hypertension    Osteopenia  PAD (peripheral artery disease) (HCC)    Pulmonary nodule    S/P TAVR (transcatheter aortic valve replacement) 11/17/2018   26 mm Edwards Sapien 3 transcatheter heart valve placed via left transaxillary approach    Severe aortic stenosis    Thoracic aortic ectasia (HCC)    Uses walker    Vitamin D deficiency     Past Surgical History:  Procedure Laterality Date   CARDIAC CATHETERIZATION     COLONOSCOPY  08/2009   SCREENING..HYPLASTIC POLYPECTOMY..DR. DARRELUS   INTRAVASCULAR PRESSURE WIRE/FFR STUDY N/A  10/16/2018   Procedure: INTRAVASCULAR PRESSURE WIRE/FFR STUDY;  Surgeon: Sherren Mocha, MD;  Location: [place] [facility];  Service: Cardiovascular;  Laterality: N/A;   RIGHT/LEFT HEART CATH AND CORONARY ANGIOGRAPHY N/A 10/16/2018   Procedure: RIGHT/LEFT HEART CATH AND CORONARY ANGIOGRAPHY;  Surgeon: Sherren Mocha, MD;  Location: [place];  Service: Cardiovascular;  Laterality: N/A;   TEE WITHOUT CARDIOVERSION N/A 11/17/2018   Procedure: TRANSESOPHAGEAL ECHOCARDIOGRAM (TEE);  Surgeon: Sherren Mocha, MD;  Location: [facility];  Service: Open Heart Surgery;  Laterality: N/A;    Current Medications: Outpatient Medications Prior to Visit  Medication Sig Dispense Refill   ALPRAZolam (XANAX) 0.25 MG tablet Take 0.25 mg by mouth at bedtime.     amoxicillin (AMOXIL) 500 MG tablet Take 4 tablets (2,000 mg total) by mouth as directed. Take 1 hour prior to dental work, including routine cleanings. 12 tablet 12   apixaban (ELIQUIS) 2.5 MG TABS tablet Take 1 tablet (2.5 mg total) by mouth 2 (two) times daily. 60 tablet 11   aspirin EC 81 MG EC tablet Take 1 tablet (81 mg total) by mouth daily. 90 tablet 1   docusate sodium (COLACE) 100 MG capsule Take 100 mg by mouth 2 (two) times daily.      finasteride (PROSCAR) 5 MG tablet Take 5 mg by mouth at bedtime.      Iron-FA-B Cmp-C-Biot-Probiotic (FUSION PLUS PO) Take 1 capsule by mouth at bedtime.      Multiple Vitamin (MULTIVITAMIN) tablet Take 1 tablet by mouth at bedtime.      Omega-3 Fatty Acids (FISH OIL) 1000 MG CAPS Take 1,000 mg by mouth at bedtime.      pantoprazole (PROTONIX) 40 MG tablet Take 1 tablet (40 mg total) by mouth daily. 90 tablet 1   simvastatin (ZOCOR) 20 MG tablet Take 20 mg by mouth at bedtime.      VITAMIN D, CHOLECALCIFEROL, PO Take 1 tablet by mouth at bedtime.     No facility-administered medications prior to visit.      Allergies:   Codeine phosphate [codeine], Sulfa antibiotics, and Asa [aspirin]    Social History   Socioeconomic History   Marital status: Widowed    Spouse name: Not on file   Number of children: 1   Years of education: Not on file   Highest education level: Not on file  Occupational History   Occupation: retired    Comment: Advertising account planner strain: Not on file   Food insecurity    Worry: Not on file    Inability: Not on file   Transportation needs    Medical: Not on file    Non-medical: Not on file  Tobacco Use   Smoking status: Never Smoker   Smokeless tobacco: Never Used  Substance and Sexual Activity   Alcohol use: No   Drug use: No   Sexual activity: Never  Lifestyle   Physical activity  Days per week: Not on file    Minutes per session: Not on file   Stress: Not on file  Relationships   Social connections    Talks on phone: Not on file    Gets together: Not on file    Attends religious service: Not on file    Active member of club or organization: Not on file    Attends meetings of clubs or organizations: Not on file    Relationship status: Not on file  Other Topics Concern   Not on file  Social History Narrative   Lives with is son     Family History:  The patient's family history includes Heart disease in his brother and sister; Hyperlipidemia in his brother and sister; Hypertension in his brother and sister.     ROS:   Please see the history of present illness.    ROS All other systems reviewed and are negative.   PHYSICAL EXAM:   VS:  BP (!) 152/88    Pulse 75    Ht 5' 8.5" (1.74 m)    Wt 142 lb 9.6 oz (64.7 kg)    SpO2 99%    BMI 21.37 kg/m    GEN: Well nourished, well developed, in no acute distress HEENT: normal Neck: no JVD or masses Cardiac: irreg irreg; soft flow murmur. No rubs, or gallops,no edema  Respiratory:  clear to auscultation bilaterally, normal work of breathing GI: soft, nontender, nondistended, + BS MS: no deformity or atrophy Skin: warm and  dry, no rash Neuro:  Alert and Oriented x 3, Strength and sensation are intact Psych: euthymic mood, full affect   Wt Readings from Last 3 Encounters:  12/16/18 142 lb 9.6 oz (64.7 kg)  11/30/18 137 lb 8 oz (62.4 kg)  11/25/18 142 lb 6.4 oz (64.6 kg)      Studies/Labs Reviewed:   EKG:  EKG is ordered today.  The ekg ordered today demonstrates afib HR 78  Recent Labs: 11/13/2018: ALT 21; B Natriuretic Peptide 168.4 11/18/2018: Magnesium 1.9 11/25/2018: TSH 2.530 11/28/2018: BUN 9; Creatinine, Ser 0.82; Potassium 4.0; Sodium 131 11/29/2018: Hemoglobin 10.3; Platelets 343   Lipid Panel No results found for: CHOL, TRIG, HDL, CHOLHDL, VLDL, LDLCALC, LDLDIRECT  Additional studies/ records that were reviewed today include:  TAVR OPERATIVE NOTE   Date of Procedure:11/17/2018  Preoperative Diagnosis:Severe Aortic Stenosis   Postoperative Diagnosis:Same   Procedure:   Transcatheter Aortic Valve Replacement - Left Transaxillary Approach Edwards Sapien 3 UltraTHV (size 74mm, model # [id number], serial # [id number])  Co-Surgeons:Clarence H. Roxy Manns, MD and Sherren Mocha, MD  Anesthesiologist:Ryan Roanna Banning, MD  Echocardiographer:Peter Johnsie Cancel, MD  Pre-operative Echo Findings: ? Severe aortic stenosis ? Normalleft ventricular systolic function  Post-operative Echo Findings: ? Noparavalvular leak ? Normalleft ventricular systolic function  _____________   11/18/18: IMPRESSIONS 1. The left ventricle has hyperdynamic systolic function, with an ejection fraction of >65%. The cavity size was normal. Left ventricular diastolic Doppler parameters are consistent with impaired relaxation. 2. The right ventricle has normal systolic function. The cavity was normal. There is no increase in right ventricular wall thickness. Right ventricular systolic pressure Cannot assess  as IVC not well visualized. 3. Left atrial size was mildly dilated. 4. There is moderate mitral annular calcification present and trivial mitral regurgitation. 5. There is trivial tricuspid regurgitation. 6. The aorta is normal in size and structure. 7. - TAVR: 26 mm Sapien 3 valve that appears to be functioning normally. There is no perivalvular AI. AV Mean  Grad:8.0 mmHg. AV Vmax:214.00 cm/s. AV Area (Vmax):2.04 cm. LVOT/AV VTI ratio:0.55. 8. Compared to prior echo there is now a TAVR.    _____________  Echo 12/16/18: IMPRESSIONS  1. The left ventricle has normal systolic function with an ejection fraction of 60-65%. The cavity size was normal. There is moderately increased left ventricular wall thickness. Left ventricular diastolic function could not be evaluated secondary to  atrial fibrillation.  2. The right ventricle has normal systolic function. The cavity was normal. There is no increase in right ventricular wall thickness.  3. Right atrial size was mildly dilated.  4. The mitral valve is grossly normal. There is mild to moderate mitral annular calcification present. Mitral valve regurgitation is moderate by color flow Doppler.  5. The aorta is abnormal unless otherwise noted.  6. There is mild dilatation of the aortic root and of the ascending aorta.  7. The atrial septum is grossly normal.  _______________  [facility] Cardiomyopathy Questionnaire  KCCQ-12 12/16/2018  1 a. Ability to shower/bathe Quite a bit limited  1 b. Ability to walk 1 block Quite a bit limited  1 c. Ability to hurry/jog Extremely limited  2. Edema feet/ankles/legs Never over the past 2 weeks  3. Limited by fatigue All of the time  4. Limited by dyspnea All of the time  5. Sitting up / on 3+ pillows Never over the past 2 weeks  6. Limited enjoyment of life Extremely limited  7. Rest of life w/ symptoms Not at all satisfied  8 a. Participation in hobbies Severely limited  8 b. Participation in  chores Severely limited  8 c. Visiting family/friends Severely limited    ASSESSMENT & PLAN:   Severe AS s/p TAVR: echo today shows EF 60-65%, normally functioning TAVR with mean gradient 8.4 mm Hg and no PVL and moderate MR. He has NYHA class III symptoms with continued fatigue, shortness of breath and failure to thrive. KCCQ completed and copied above. SBE prophylaxis discussed; he has amoxicillin. Continue aspirin and eliquis. ASA can be discontinued after 6 months of therapy (04/2019).  New onset persistent atrial fibrillation: newly diagnosed with afib during most recent admission. Placed on low dose Eliquis 2.5mg  BID. Rate well controlled without AVN blocking agents. This seems to be asymptomatic. His symptoms of fatigue and shortness of breath preceded the atrial fibrillation, so I doubt this is contributing. I have offered him DCCV after 3-4 weeks of uninterrupted anticoagulation to rule out afib as a contributing factor.   HTN: BP elevated. We are allowing for permissive HTN given recent admission with significant orthostatic hypotension. He is now wearing compression stockings.   CAD: he has significant, multivessel CAD. Cardiac cath 6/26 showedsignificantCADincluding 100% chronic occlusion of the right coronary artery with 50-60% stenosis of the left main coronary artery with positive DFR of the left main/LCx (0.84). Continue medical therapy.   Chronic diastolic CHF: appears euvolemic off diuretics.   Hyponatremia: this is chronic and stable   Incidental findings: noted on pre TAVR CT scan.  Two small areas of urothelial hyperenhancement associated with the urinary bladder and a left-sided bladder diverticulum. This is nonspecific, but further clinical evaluation is recommended to exclude the possibility of urothelial neoplasm.Discussed with Dr. Roxy Manns who felt that urology consultation was not necessary; however, patient would like to be seen so referral has been  placed.  Smoothly marginated right lower lobe pulmonary nodule with a mean diameter of 6 mm. Non-contrast chest CT at 6-12 months is recommended. If the  nodule is stable at time of repeat CT, then future CT at 18-24 months (from today's scan) is considered optional for low-risk patients, but is recommended for high-risk patients.Patient is a never smoker. Patient is not interested in setting up a scan to follow this at this time.   Failure to thrive: the patient and son are very frustrated with his continued decline with chronic fatigue, general malaise, shortness of breath and decreased appetite. Recent blood work including CBC, TSH, and BMET were largely unremarkable. His TAVR is functioning normally, his EF is normal and has only moderate MR. He has no s/s CHF. He was recently admitted for syncope and found to have significant orthostatic hypotension and also diagnosed with new onset atrial new fibrillation with good rate control. His symptoms preceded this admission and have not gotten significantly worse since being diagnosed with atrial fibrillation. I am doubtful that electrical cardioversion would alleviate his symptoms. He does have significant CAD with CTO RCA and hemodynamically significant LM disease by DFR. I do not think he has good PCI targets, but will ask Dr. Burt Knack to review cath films. I wonder if a trial of antianginals might help him in the case that his shortness of breath and fatigue are anginal equivalents; however, these would certainly worsen his ongoing issues with orthostatic hypotension. ?Trial of Ranexa. Will discuss case with Dr. Burt Knack and Dr. Roxy Manns to see if they have any other suggestions. Otherwise, we will need to address goals of care as this may just be the result of him slowing down due to his advanced age.   Total time spent with patient was over 40 minutes which included evaluating patient, reviewing record and coordinating care. Face to face time >50%.    Medication  Adjustments/Labs and Tests Ordered: Current medicines are reviewed at length with the patient today.  Concerns regarding medicines are outlined above.  Medication changes, Labs and Tests ordered today are listed in the Patient Instructions below. Patient Instructions  Medication Instructions:  No changes--please stop aspirin after 05/20/19 If you need a refill on your cardiac medications before your next appointment, please call your pharmacy.   Lab work: none If you have labs (blood work) drawn today and your tests are completely normal, you will receive your results only by:  [place] (if you have MyChart) OR  A paper copy in the mail If you have any lab test that is abnormal or we need to change your treatment, we will call you to review the results.  Testing/Procedures: none  Follow-Up: in one year with Angelena Form PA-C.  This has been scheduled for 12/08/19 at 1:30 pm   Any Other Special Instructions Will Be Listed Below (If Applicable).       Signed, Angelena Form, PA-C  12/16/2018 7:55 PM    [facility] [place], [place],   [address] Phone: [phone]; Fax: [phone]

## 2018-12-17 ENCOUNTER — Telehealth: Payer: Medicare Other | Admitting: Physician Assistant

## 2018-12-17 MED ORDER — ASPIRIN 81 MG PO TBEC: 81.0000 mg | DELAYED_RELEASE_TABLET | Freq: Every day | ORAL | 1 refills | Status: AC

## 2018-12-29 ENCOUNTER — Telehealth: Payer: Self-pay | Admitting: Physician Assistant

## 2018-12-29 NOTE — Telephone Encounter (Signed)
  [facility]ULAR [facility]ENTER   MULTIDISCIPLINARY HEART VALVE TEAM  I had Dr. Roxy Manns and Dr. Burt Knack review the patients cath films. PCI not felt to be a feasible option for him and not a candidate for CABG. Additionally, symptoms are not clearly related to ischemic heart disease. He mostly complaints of profound weakness with no obvious cause. I have called the patient and his son and discussed this with them. They were very grateful for the call.   Angelena Form PA-C  [facility]

## 2019-11-01 ENCOUNTER — Other Ambulatory Visit: Payer: Self-pay | Admitting: Physician Assistant

## 2019-11-01 DIAGNOSIS — Z952 Presence of prosthetic heart valve: Secondary | ICD-10-CM

## 2019-11-28 IMAGING — CR CHEST - 2 VIEW
2 series · 2 of 2 positions shown · non-contrast
Comparison: 10/09/2017

CLINICAL DATA: Preoperative, aortic valve replacement

EXAM:
CHEST - 2 VIEW

[w chest pa]
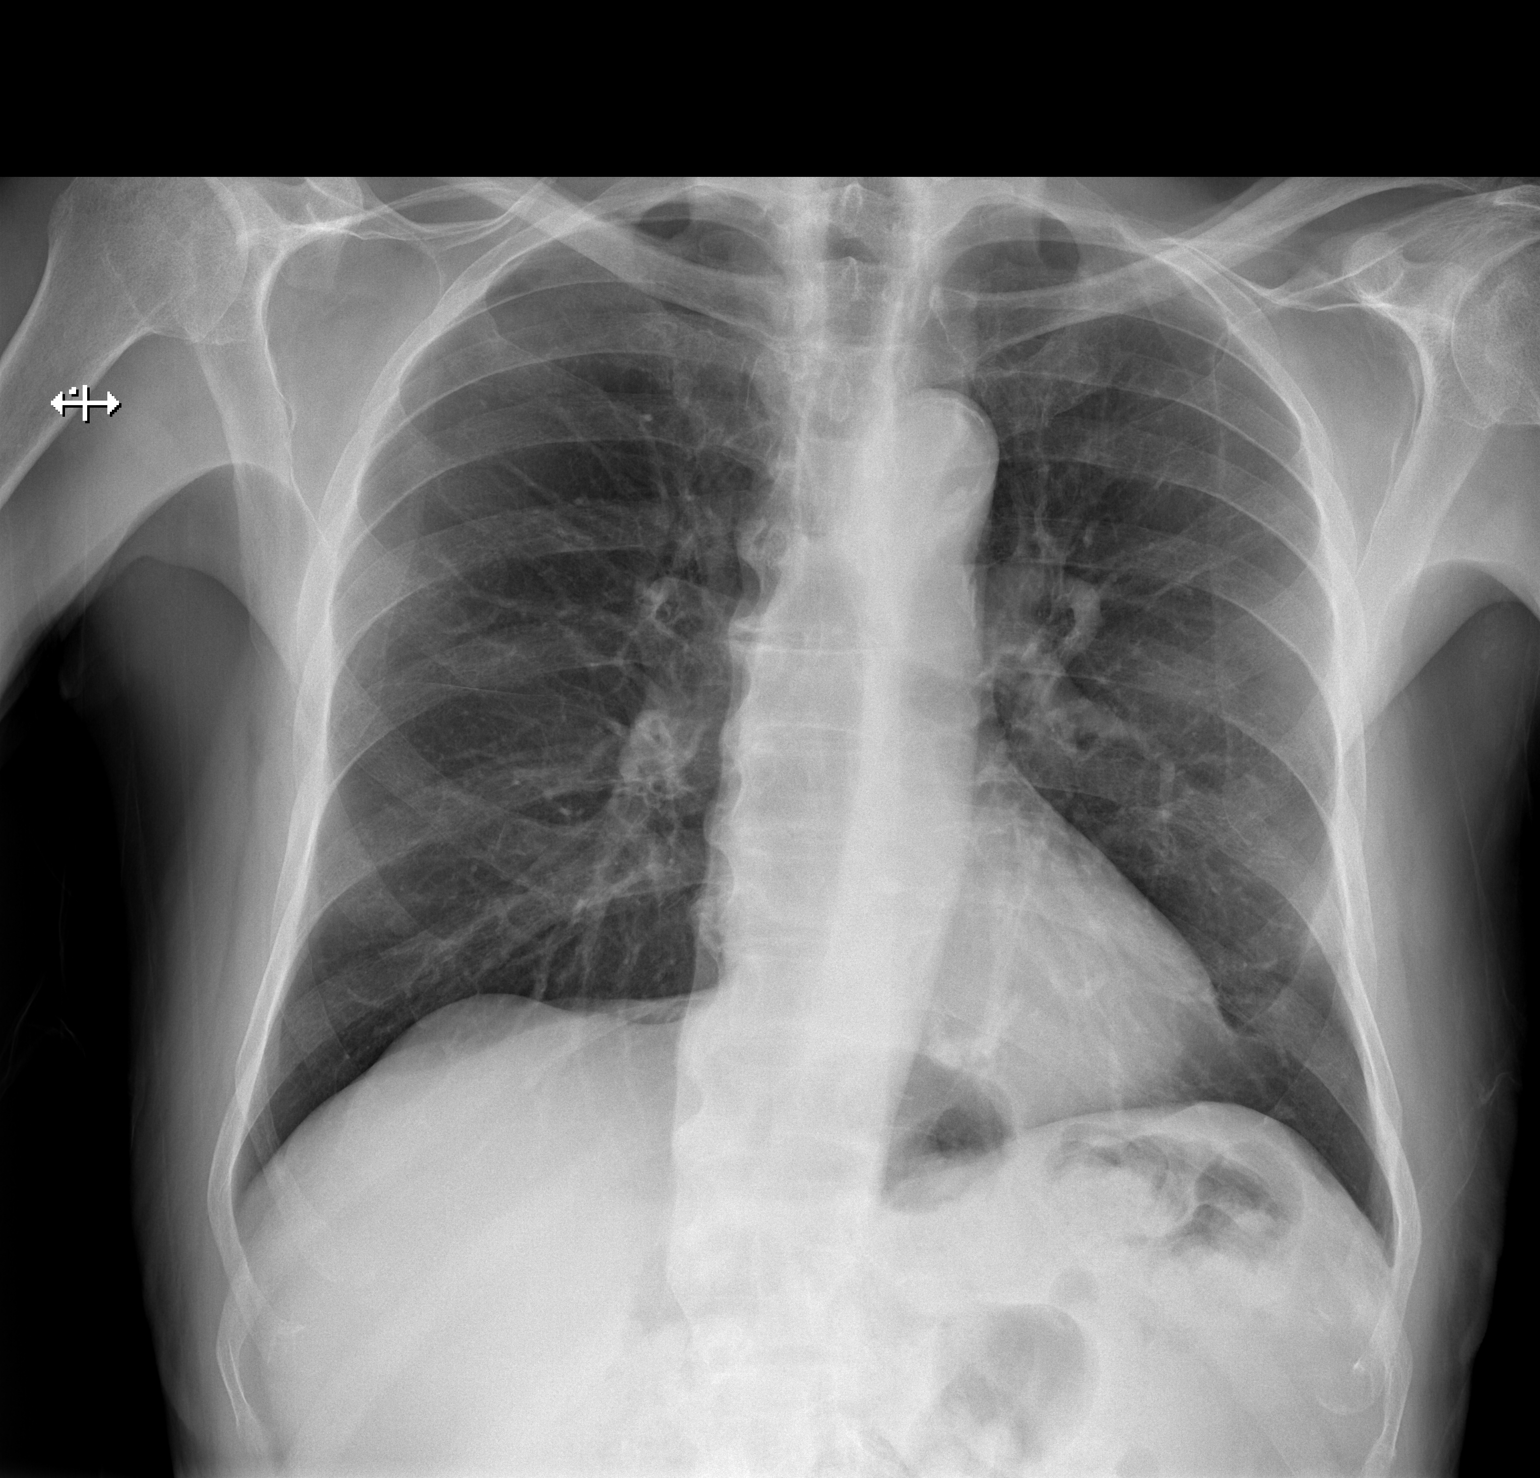

[w chest lat]
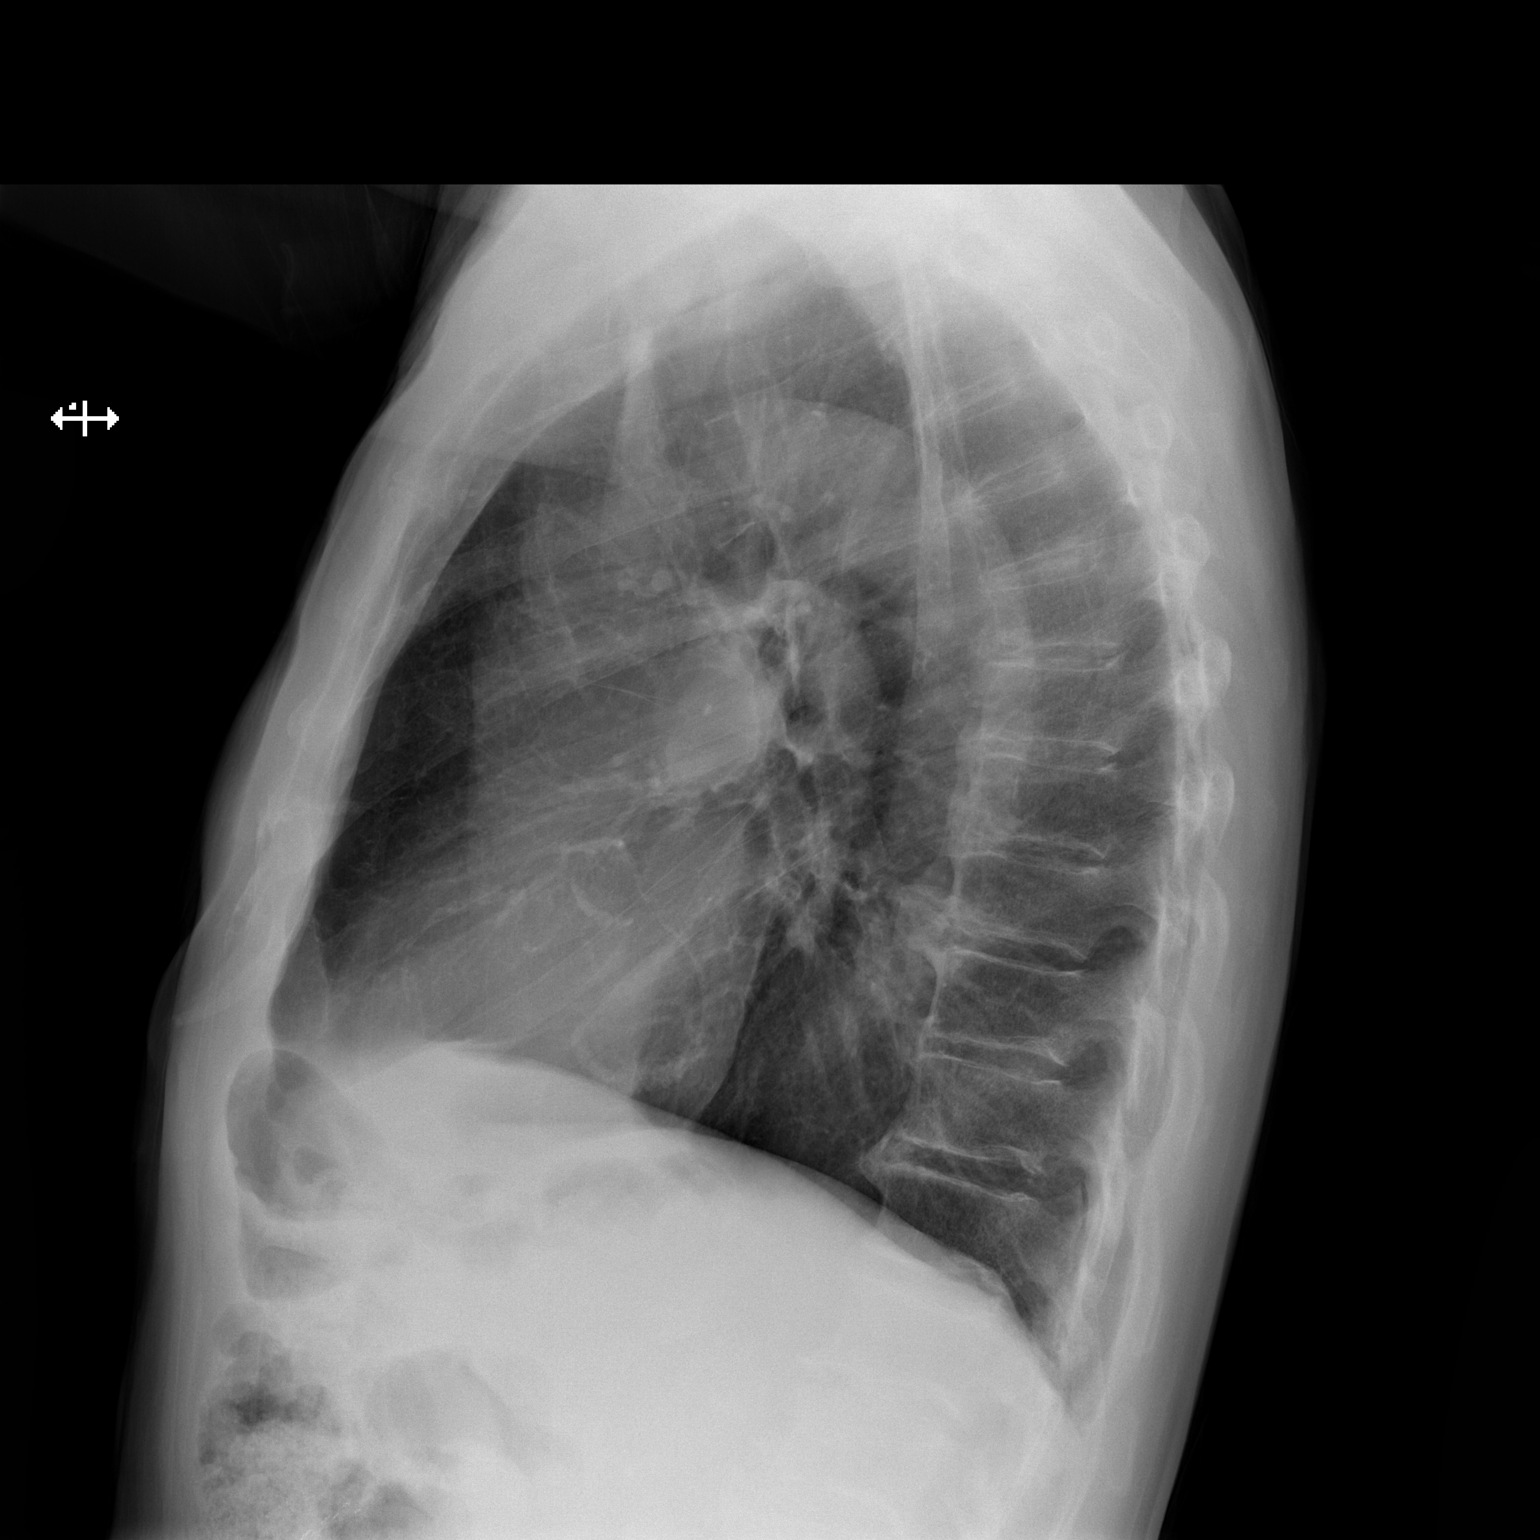

[2 of 2 positions shown; findings below may reference images not displayed]

FINDINGS: The heart size and mediastinal contours are within normal limits.
Both lungs are clear. Disc degenerative disease of the thoracic
spine.
IMPRESSION: No acute abnormality of the lungs.

## 2019-12-02 IMAGING — DX PORTABLE CHEST - 1 VIEW
2 series · 2 of 2 positions shown · non-contrast
Comparison: November 13, 2018

CLINICAL DATA: Status post TAVR

EXAM:
PORTABLE CHEST 1 VIEW

[chest ap (1 of 2)]
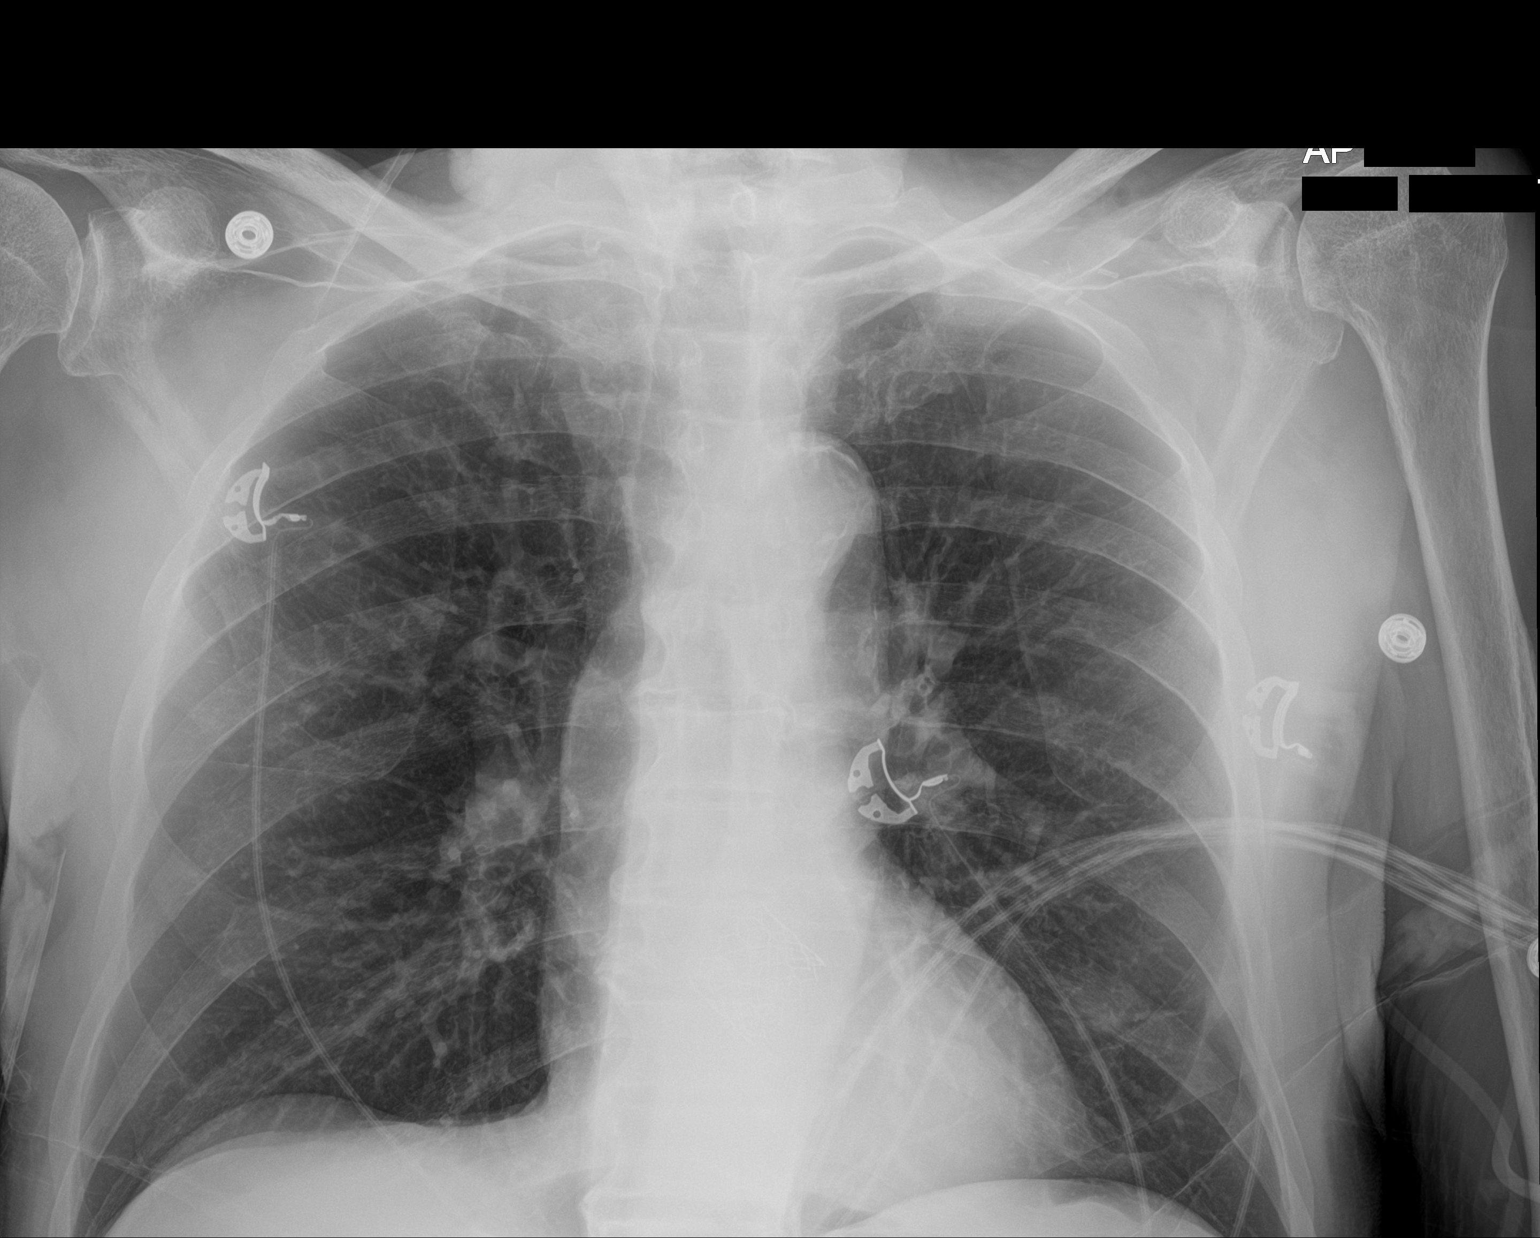

[chest ap (2 of 2)]
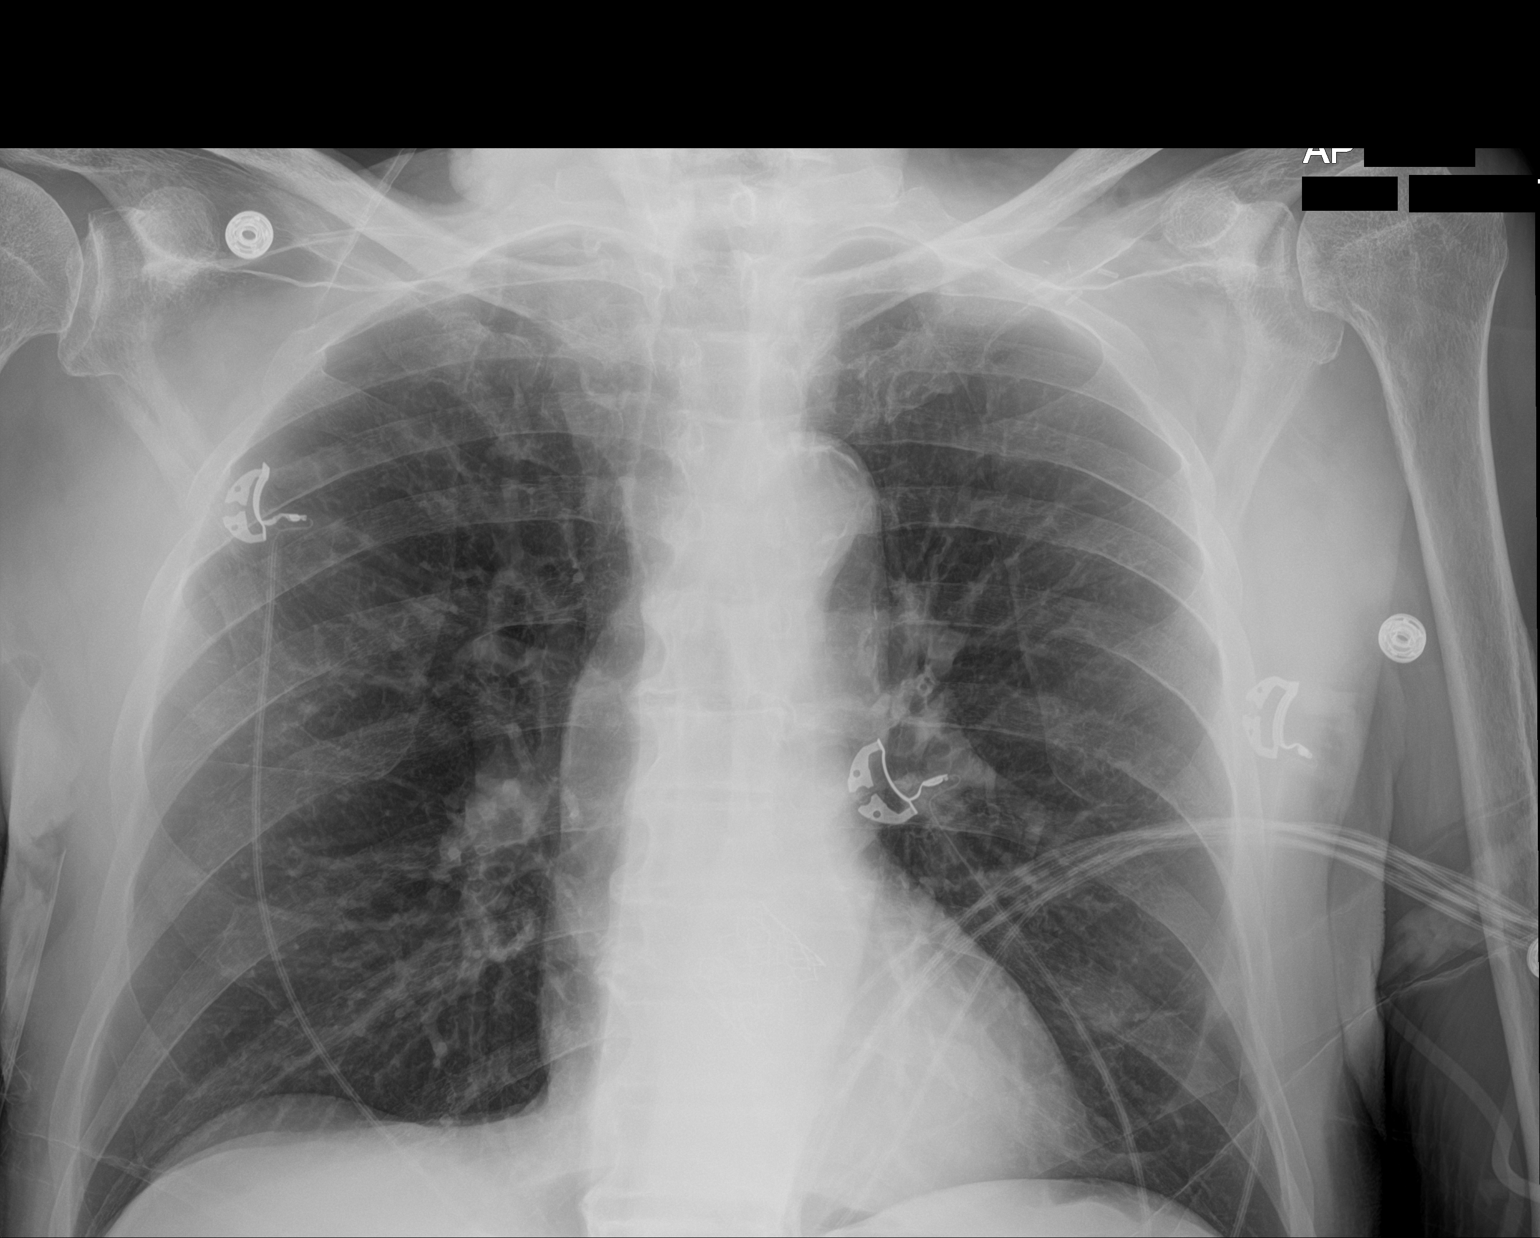

[2 of 2 positions shown; findings below may reference images not displayed]

FINDINGS: The lungs are clear. No pneumothorax or focal airspace
consolidation. No pleural effusion. The patient is status post TAVR
with stent in place. There is atherosclerotic calcification the
aortic knob with a tortuous descending aorta. the cardiomediastinal
silhouette is otherwise unremarkable.
IMPRESSION: Status post TAVR.  No acute cardiopulmonary process

## 2019-12-06 ENCOUNTER — Telehealth: Payer: Self-pay

## 2019-12-06 NOTE — Telephone Encounter (Signed)
The patient understands he will keep echo 8/18 and move to a virtual visit with Nell Range on 8/19 due to her being ill. He will have BP, HR and weight ready when she calls.  Consent obtained.    Patient Consent for Virtual Visit         Stephen Wolf has provided verbal consent on 12/06/2019 for a virtual visit (video or telephone).   CONSENT FOR VIRTUAL VISIT FOR:  Stephen Wolf  By participating in this virtual visit I agree to the following:  I hereby voluntarily request, consent and authorize [facility] and its employed or contracted physicians, physician assistants, nurse practitioners or other licensed health care professionals (the Practitioner), to provide me with telemedicine health care services (the "Services") as deemed necessary by the treating Practitioner. I acknowledge and consent to receive the Services by the Practitioner via telemedicine. I understand that the telemedicine visit will involve communicating with the Practitioner through live audiovisual communication technology and the disclosure of certain medical information by electronic transmission. I acknowledge that I have been given the opportunity to request an in-person assessment or other available alternative prior to the telemedicine visit and am voluntarily participating in the telemedicine visit.  I understand that I have the right to withhold or withdraw my consent to the use of telemedicine in the course of my care at any time, without affecting my right to future care or treatment, and that the Practitioner or I may terminate the telemedicine visit at any time. I understand that I have the right to inspect all information obtained and/or recorded in the course of the telemedicine visit and may receive copies of available information for a reasonable fee.  I understand that some of the potential risks of receiving the Services via telemedicine include:  [name] Delay or interruption in medical evaluation  due to technological equipment failure or disruption; . Information transmitted may not be sufficient (e.g. poor resolution of images) to allow for appropriate medical decision making by the Practitioner; and/or  . In rare instances, security protocols could fail, causing a breach of personal health information.  Furthermore, I acknowledge that it is my responsibility to provide information about my medical history, conditions and care that is complete and accurate to the best of my ability. I acknowledge that Practitioner's advice, recommendations, and/or decision may be based on factors not within their control, such as incomplete or inaccurate data provided by me or distortions of diagnostic images or specimens that may result from electronic transmissions. I understand that the practice of medicine is not an exact science and that Practitioner makes no warranties or guarantees regarding treatment outcomes. I acknowledge that a copy of this consent can be made available to me via my patient portal ([place]), or I can request a printed copy by calling the office of [facility].    I understand that my insurance will be billed for this visit.   I have read or had this consent read to me. . I understand the contents of this consent, which adequately explains the benefits and risks of the Services being provided via telemedicine.  . I have been provided ample opportunity to ask questions regarding this consent and the Services and have had my questions answered to my satisfaction. . I give my informed consent for the services to be provided through the use of telemedicine in my medical care

## 2019-12-08 ENCOUNTER — Ambulatory Visit (HOSPITAL_COMMUNITY): Payer: Medicare Other | Attending: Cardiovascular Disease

## 2019-12-08 ENCOUNTER — Other Ambulatory Visit: Payer: Self-pay

## 2019-12-08 ENCOUNTER — Ambulatory Visit: Payer: Medicare Other | Admitting: Physician Assistant

## 2019-12-08 DIAGNOSIS — Z952 Presence of prosthetic heart valve: Secondary | ICD-10-CM | POA: Diagnosis not present

## 2019-12-08 LAB — ECHOCARDIOGRAM COMPLETE
AR max vel: 1.36 cm2
AV Area VTI: 1.56 cm2
AV Area mean vel: 1.43 cm2
AV Mean grad: 10.2 mmHg
AV Peak grad: 21.7 mmHg
Ao pk vel: 2.33 m/s
Area-P 1/2: 3.92 cm2
S' Lateral: 2.4 cm

## 2019-12-08 NOTE — Progress Notes (Signed)
[facility]   MULTIDISCIPLINARY HEART VALVE TEAM  Virtual Visit via Telephone Note   This visit type was conducted due to national recommendations for restrictions regarding the COVID-19 Pandemic (e.g. social distancing) in an effort to limit this patient's exposure and mitigate transmission in our community.  Due to his co-morbid illnesses, this patient is at least at moderate risk for complications without adequate follow up.  This format is felt to be most appropriate for this patient at this time.  The patient did not have access to video technology/had technical difficulties with video requiring transitioning to audio format only (telephone).  All issues noted in this document were discussed and addressed.  No physical exam could be performed with this format.  Please refer to the patient's chart for his  consent to telehealth for [facility].   Evaluation Performed:  Follow-up visit  Date:  12/09/2019   ID:  Stephen Wolf, DOB 1925-12-02, MRN [id number]  Patient Location: Home Provider Location: Home Office  PCP:  Secundino Ginger, PA-C  Cardiologist:  Franchot Mimes, MD / Dr. Burt Knack & Dr. Roxy Manns (TAVR)  Chief Complaint:  1 year s/p TAVR  History of Present Illness:    Stephen Wolf is a 86 y.o. male with a history of HTN, HLD, PAD, iron deficiency anemia, multivessel CAD, rheumatic fever during childhood, atrial fibrilltation on Eliquis and severe ASs/p TAVR (11/14/18)who presents via virtual medicine for follow up.   Last year he developed worsening fatigue, generalized weakness, exertional shortness of breath, loss of appetite, weight loss, and occasional dizzy spells. Follow-up echocardiogram performed at [facility] Aug 31, 2018 revealed significant progression and severity of aortic stenosis with peak velocity across the aortic valve measured 3.9 m/s. Cardiac cath 6/26 showedsignificantCADincluding 100% chronic occlusion of the right  coronary artery with 50-60% stenosis of the left main coronary artery with positive DFR of the left main/LCx (0.84). Planwasto treat CAD medically.  He underwentsuccessful TAVR with a42mm Edwards Sapien UltraTHV via theleft subclavianapproach on 11/17/18.Post operative echo showed EF 65%, normally functioning TAVR with mean gradient of 8 mm Hg and no PVL. He was discharged on aspirin and plavix.   The patient had a syncopal episode on 11/27/2018 and was readmitted to the hospital from 8/7-8/01/2019 where he was diagnosed with significant orthostatic hypotension as well as new onset atrial fibrillation. His home antihypertension medications were discontinued and plan was to allow for permissive hypertension given dramatic drop in blood pressure with positional changes. During admission he had an unusual neurologic spell where he had a blank stare and drooling episode after standing up from using the bathroom.  Neurology was consulted and EEG was negative. Conservative treatment was recommended. He was discharged on Eliquis 2.5 mg twice daily and aspirin 81 mg daily.  Plavix was discontinued. The patient never had any appreciable improvement after TAVR. 1 month echo showed EF 60-65%, normally functioning TAVR with mean gradient 8.4 mm Hg and no PVL and moderate MR.  Today he presents via virtual medicine for follow up. His son Stephen Wolf is present for the visit. He still has no energy and it is "miserable." No chest pain. Some shortness of breath, but mostly just fatigue. The patient does not have symptoms concerning for COVID-19 infection (fever, chills, cough, or new shortness of breath).    Past Medical History:  Diagnosis Date  . Anemia    iron defiency  . Anxiety   . Coronary artery disease involving left main coronary  artery 10/16/2018  . Hearing loss    bilateral - no hearing aids  . History of colon cancer   . Hyperlipidemia    MIXED  . Hypertension   . Osteopenia   . PAD (peripheral  artery disease) ([place])   . Pulmonary nodule   . 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 Severe aortic stenosis   . Thoracic aortic ectasia ([place])   . Uses walker   . Vitamin D deficiency    Past Surgical History:  Procedure Laterality Date  . CARDIAC CATHETERIZATION    . COLONOSCOPY  08/2009   SCREENING..HYPLASTIC POLYPECTOMY..DR. DARRELUS  . INTRAVASCULAR PRESSURE WIRE/FFR STUDY N/A 10/16/2018   Procedure: INTRAVASCULAR PRESSURE WIRE/FFR STUDY;  Surgeon: Sherren Mocha, MD;  Location: [place] [facility];  Service: Cardiovascular;  Laterality: N/A;  . RIGHT/LEFT HEART CATH AND CORONARY ANGIOGRAPHY N/A 10/16/2018   Procedure: RIGHT/LEFT HEART CATH AND CORONARY ANGIOGRAPHY;  Surgeon: Sherren Mocha, MD;  Location: [place] [facility];  Service: Cardiovascular;  Laterality: N/A;  . TEE WITHOUT CARDIOVERSION N/A 11/17/2018   Procedure: TRANSESOPHAGEAL ECHOCARDIOGRAM (TEE);  Surgeon: Sherren Mocha, MD;  Location: [place];  Service: Open Heart Surgery;  Laterality: N/A;     Current Meds  Medication Sig  . ALPRAZolam (XANAX) 0.25 MG tablet Take 0.25 mg by mouth at bedtime.  [name] amoxicillin (AMOXIL) 500 MG tablet Take 4 tablets (2,000 mg total) by mouth as directed. Take 1 hour prior to dental work, including routine cleanings.  [name] apixaban (ELIQUIS) 2.5 MG TABS tablet Take 1 tablet (2.5 mg total) by mouth 2 (two) times daily.  [name] docusate sodium (COLACE) 100 MG capsule Take 100 mg by mouth 2 (two) times daily.   [name] donepezil (ARICEPT) 10 MG tablet Take 10 mg by mouth at bedtime.  . finasteride (PROSCAR) 5 MG tablet Take 5 mg by mouth at bedtime.   . Iron-FA-B Cmp-C-Biot-Probiotic (FUSION PLUS PO) Take 1 capsule by mouth at bedtime.   [name] losartan (COZAAR) 50 MG tablet Take 50 mg by mouth in the morning and at bedtime.  . Multiple Vitamin (MULTIVITAMIN) tablet Take 1 tablet by mouth at  bedtime.   . Omega-3 Fatty Acids (FISH OIL) 1000 MG CAPS Take 1,000 mg by mouth at bedtime.   . pantoprazole (PROTONIX) 40 MG tablet Take 1 tablet (40 mg total) by mouth daily.  . simvastatin (ZOCOR) 20 MG tablet Take 20 mg by mouth at bedtime.   . tamsulosin (FLOMAX) 0.4 MG CAPS capsule Take 0.4 mg by mouth.  [name] VITAMIN D, CHOLECALCIFEROL, PO Take 1 tablet by mouth at bedtime.     Allergies:   Codeine phosphate [codeine], Sulfa antibiotics, and Asa [aspirin]   Social History   Tobacco Use  . Smoking status: Never Smoker  . Smokeless tobacco: Never Used  Vaping Use  . Vaping Use: Never used  Substance Use Topics  . Alcohol use: No  . Drug use: No     Family Hx: The patient's family history includes Heart disease in his brother and sister; Hyperlipidemia in his brother and sister; Hypertension in his brother and sister.  ROS:   Please see the history of present illness.    All other systems reviewed and are negative.   Prior CV studies:   The following studies were reviewed today:  TAVR OPERATIVE NOTE   Date of Procedure:11/17/2018  Preoperative Diagnosis:Severe Aortic Stenosis  Postoperative Diagnosis:Same   Procedure:   Transcatheter Aortic Valve Replacement - Left Transaxillary Approach Edwards Sapien 3 UltraTHV (size 22mm, model # [id number], serial # [id number])  Co-Surgeons:Clarence H. Roxy Manns, MD and Sherren Mocha, MD  Anesthesiologist:Ryan Roanna Banning, MD  Echocardiographer:Peter Johnsie Cancel, MD  Pre-operative Echo Findings: ? Severe aortic stenosis ? Normalleft ventricular systolic function  Post-operative Echo Findings: ? Noparavalvular leak ? Normalleft ventricular systolic function  _____________   11/18/18: IMPRESSIONS 1. The left ventricle has hyperdynamic systolic function, with an ejection fraction of >65%. The cavity size was  normal. Left ventricular diastolic Doppler parameters are consistent with impaired relaxation. 2. The right ventricle has normal systolic function. The cavity was normal. There is no increase in right ventricular wall thickness. Right ventricular systolic pressure Cannot assess as IVC not well visualized. 3. Left atrial size was mildly dilated. 4. There is moderate mitral annular calcification present and trivial mitral regurgitation. 5. There is trivial tricuspid regurgitation. 6. The aorta is normal in size and structure. 7. - TAVR: 26 mm Sapien 3 valve that appears to be functioning normally. There is no perivalvular AI. AV Mean Grad:8.0 mmHg. AV Vmax:214.00 cm/s. AV Area (Vmax):2.04 cm. LVOT/AV VTI ratio:0.55. 8. Compared to prior echo there is now a TAVR.    _____________  Echo 12/16/18: IMPRESSIONS 1. The left ventricle has normal systolic function with an ejection fraction of 60-65%. The cavity size was normal. There is moderately increased left ventricular wall thickness. Left ventricular diastolic function could not be evaluated secondary to  atrial fibrillation. 2. The right ventricle has normal systolic function. The cavity was normal. There is no increase in right ventricular wall thickness. 3. Right atrial size was mildly dilated. 4. The mitral valve is grossly normal. There is mild to moderate mitral annular calcification present. Mitral valve regurgitation is moderate by color flow Doppler. 5. The aorta is abnormal unless otherwise noted. 6. There is mild dilatation of the aortic root and of the ascending aorta. 7. The atrial septum is grossly normal.  _______________   Echo 12/08/19 IMPRESSIONS  1. Left ventricular ejection fraction, by estimation, is 60 to 65%. The left ventricle has normal function. The left ventricle has no regional wall motion abnormalities. There is moderate concentric left ventricular hypertrophy. Left ventricular  diastolic  parameters are indeterminate.  2. Right ventricular systolic function is normal. The right ventricular size is normal. There is mildly elevated pulmonary artery systolic pressure.  3. Left atrial size was moderately dilated.  4. Right atrial size was moderately dilated.  5. The mitral valve is normal in structure. Mild mitral valve regurgitation. No evidence of mitral stenosis.  6. The aortic valve has been repaired/replaced. Aortic valve regurgitation is not visualized. No aortic stenosis is present. There is a 26 mm Edwards Sapien prosthetic (TAVR) valve present in the aortic position.  7. Aortic dilatation noted. There is mild dilatation of the ascending aorta measuring 39 mm.  8. The inferior vena cava is normal in size with greater than 50% respiratory variability, suggesting right atrial pressure of 3 mmHg.  Labs/Other Tests and Data Reviewed:    EKG:  No ECG reviewed.  Recent Labs: No results found for requested labs within last 8760 hours.   Recent Lipid Panel No results found for: CHOL, TRIG, HDL, CHOLHDL, LDLCALC, LDLDIRECT  Wt Readings from Last 3 Encounters:  12/09/19 150 lb (68 kg)  12/16/18 142 lb 9.6 oz (64.7 kg)  11/30/18 137 lb 8 oz (62.4 kg)     Objective:  Vital Signs:  BP 119/61   Pulse 70   Wt 150 lb (68 kg)   BMI 22.48 kg/m    ASSESSMENT & PLAN:    Severe AS s/p TAVR: echo 8/18 showed EF 65%, normally functioning TAVR with a mean gradient of 10.5 mmHg and no PVL. He has NYHA class III symptoms; mostly of ongoing fatigue. He has amoxicillin for SBE prophylaxis. Continue on Eliqius for persistent afib. He will continue regular follow up with Roque Cash PA-C    COVID-19 Education: The signs and symptoms of COVID-19 were discussed with the patient and how to seek care for testing (follow up with PCP or arrange E-visit).  The importance of social distancing was discussed today.  Time:   Today, I have spent 15 minutes with the patient with telehealth  technology discussing the above problems.     Medication Adjustments/Labs and Tests Ordered: Current medicines are reviewed at length with the patient today.  Concerns regarding medicines are outlined above.   Tests Ordered: No orders of the defined types were placed in this encounter.   Medication Changes: No orders of the defined types were placed in this encounter.    Disposition:  Follow up prn  Signed, Angelena Form, PA-C  12/09/2019 1:50 PM    [facility]

## 2019-12-09 ENCOUNTER — Ambulatory Visit: Payer: Medicare Other | Admitting: Physician Assistant

## 2019-12-09 ENCOUNTER — Telehealth (INDEPENDENT_AMBULATORY_CARE_PROVIDER_SITE_OTHER): Payer: Medicare Other | Admitting: Physician Assistant

## 2019-12-09 VITALS — BP 119/61 | HR 70 | Wt 150.0 lb

## 2019-12-09 DIAGNOSIS — Z952 Presence of prosthetic heart valve: Secondary | ICD-10-CM | POA: Diagnosis not present

## 2019-12-12 IMAGING — CT CT HEAD WITHOUT CONTRAST
3 of 4 series · 16 of 47 positions shown, 19 images · non-contrast
Comparison: 02/25/2017

CLINICAL DATA: Syncopal episode.

EXAM:
CT HEAD WITHOUT CONTRAST
TECHNIQUE: Contiguous axial images were obtained from the base of the skull
through the vertex without intravenous contrast.

[Series 4: head 2.0 h70h · axial · 0.44mm/px · z∈[-99,+47]mm · 10 of 83 slices shown, 13 images]
[im 5/83  brain]
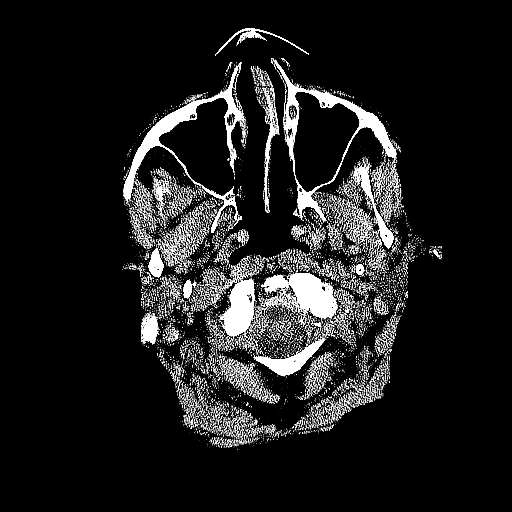
[im 5/83  bone]
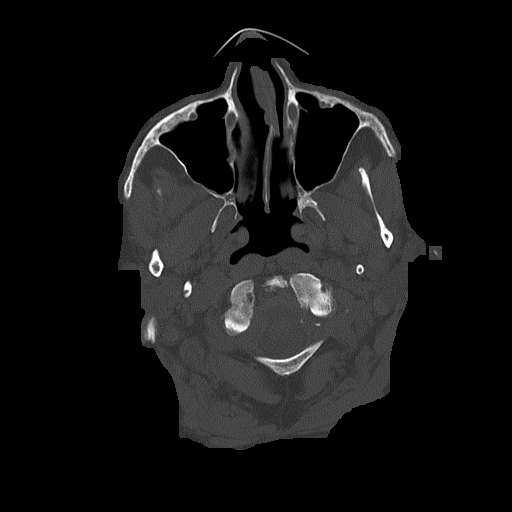
[im 13/83  brain]
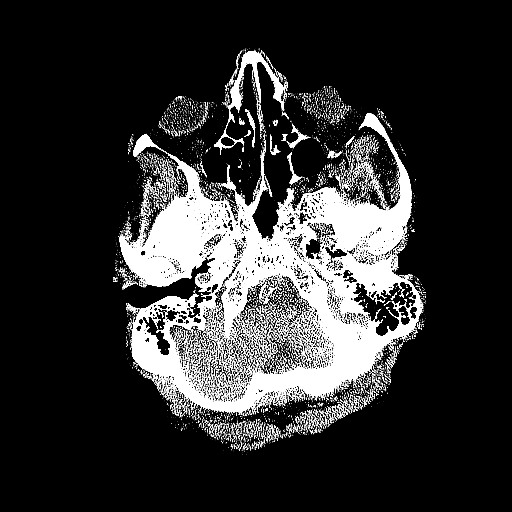
[im 21/83  brain]
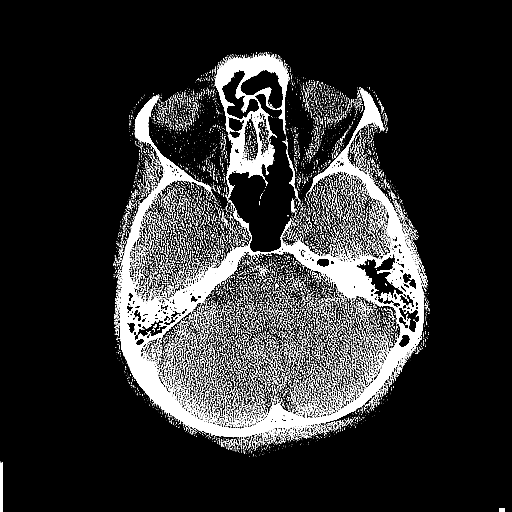
[im 29/83  brain]
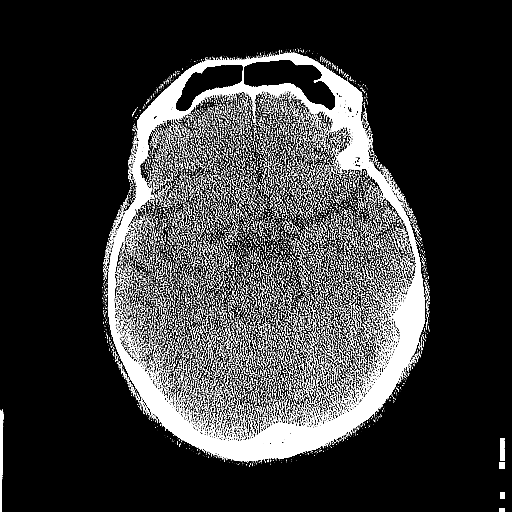
[im 37/83  brain]
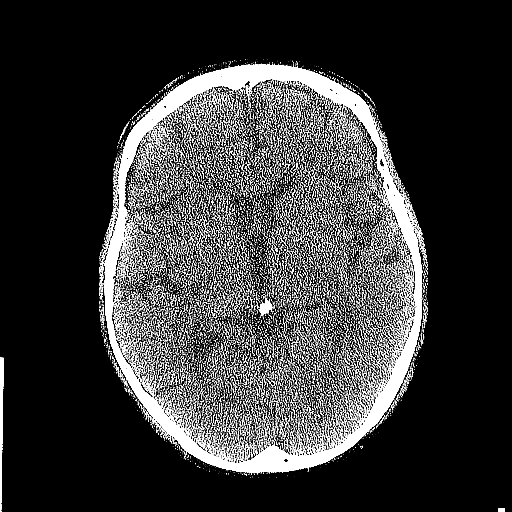
[im 37/83  bone]
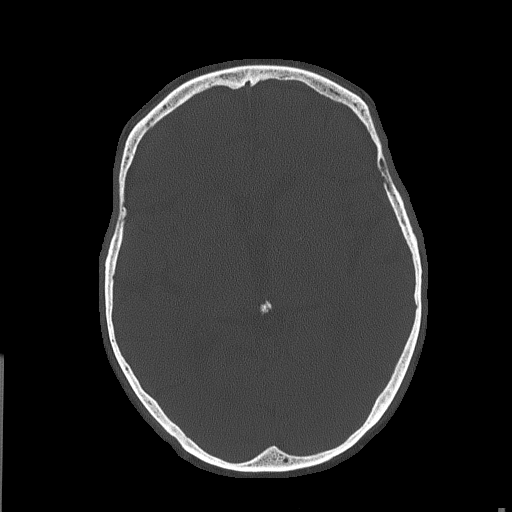
[im 46/83  brain]
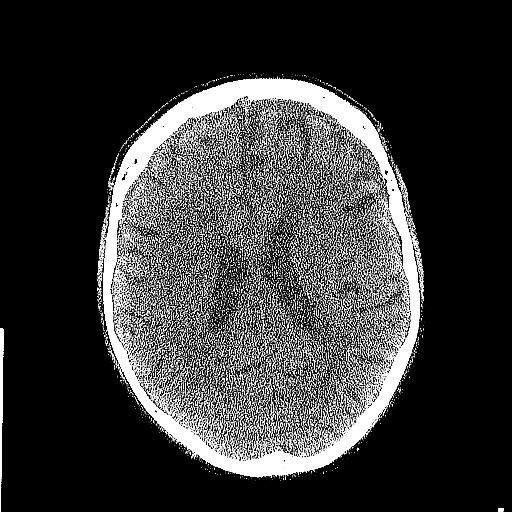
[im 54/83  brain]
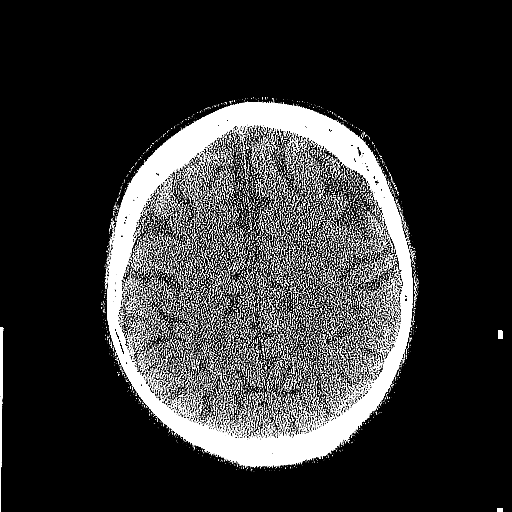
[im 62/83  brain]
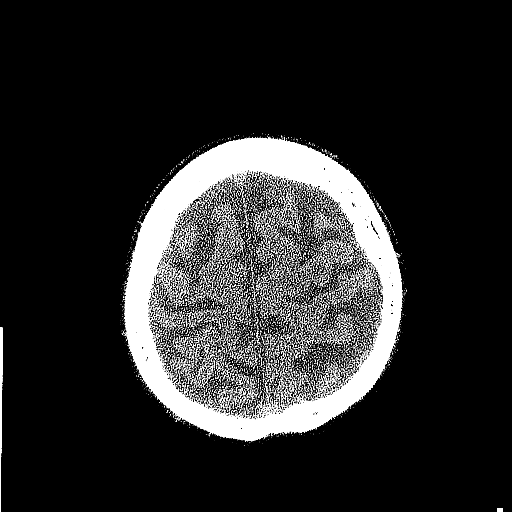
[im 70/83  brain]
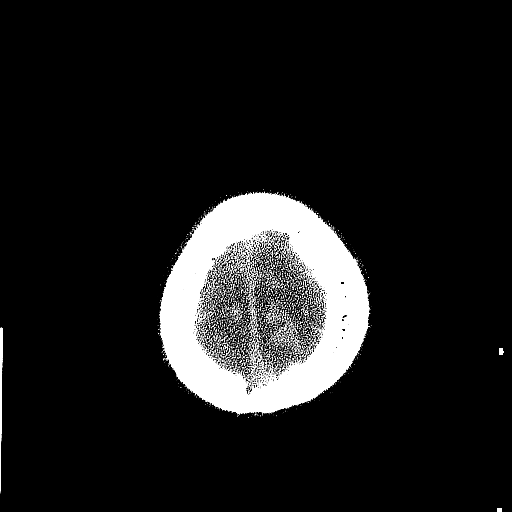
[im 70/83  bone]
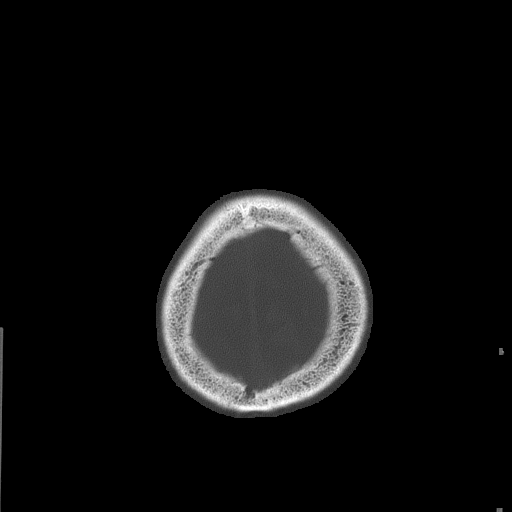
[im 78/83  brain]
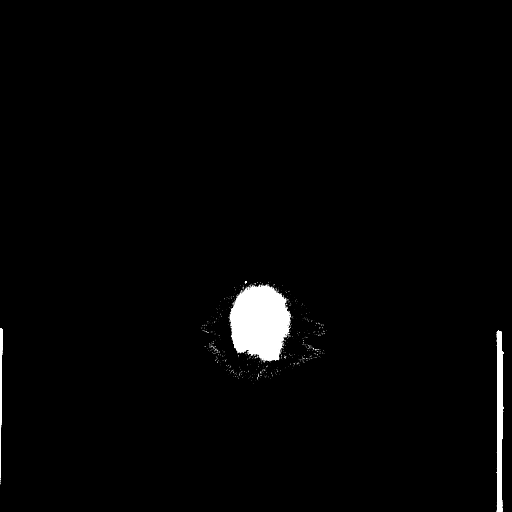

[Series 5: head 3.0 mpr cor · coronal · 0.35mm/px · 3 of 67 slices shown]
[im 23/67  brain]
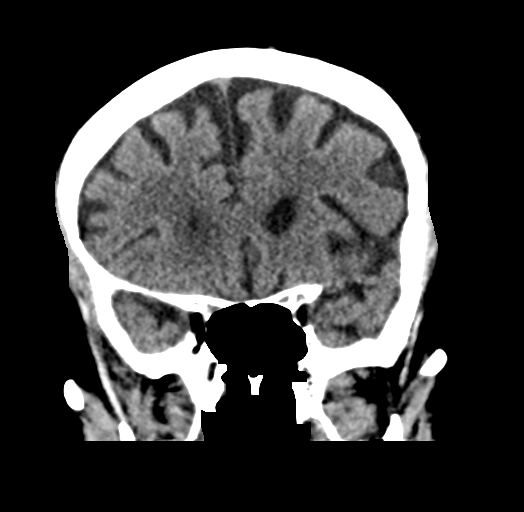
[im 30/67  brain]
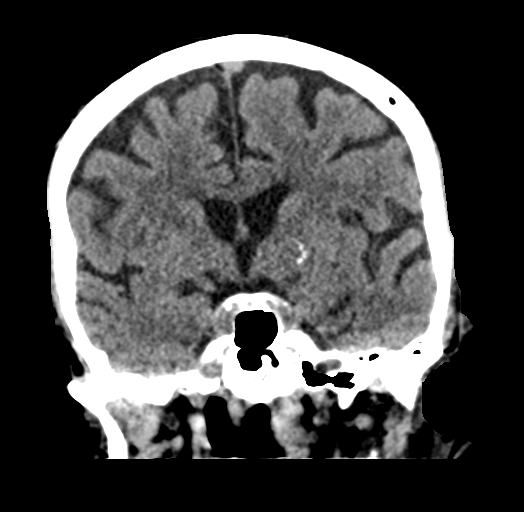
[im 37/67  brain]
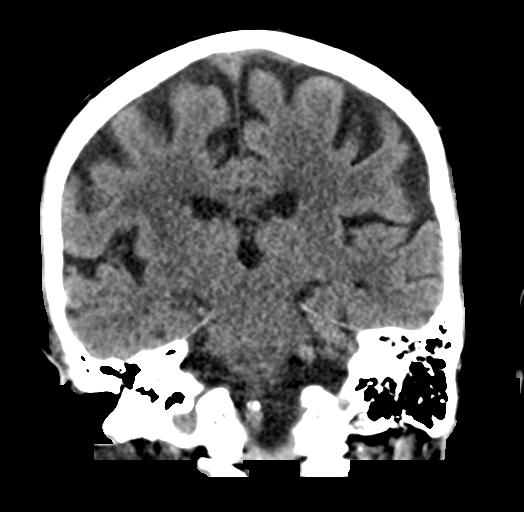

[Series 6: head 3.0 mpr sag · sagittal · 0.31mm/px · 3 of 61 slices shown]
[im 21/61  brain]
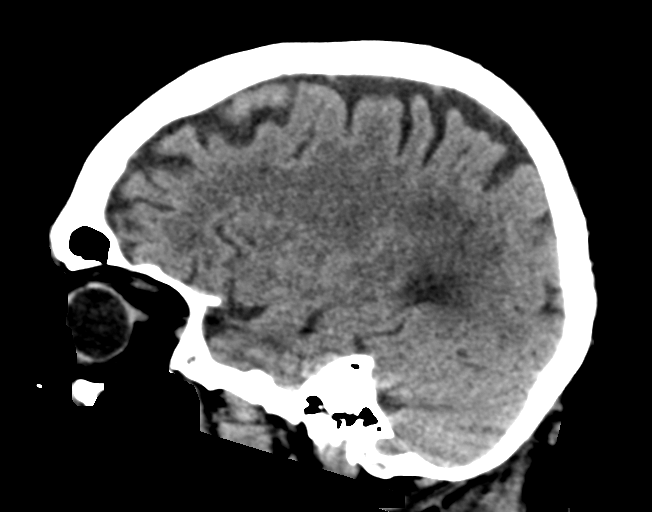
[im 31/61  brain]
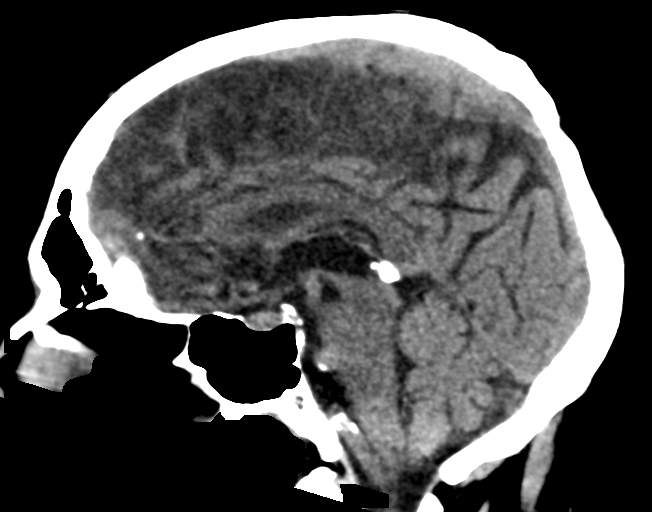
[im 41/61  brain]
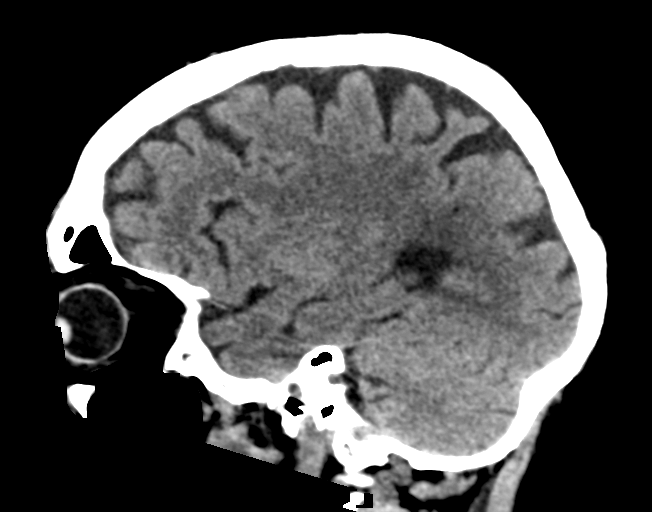

[16 of 47 positions shown; findings below may reference images not displayed]

FINDINGS: Brain: Age related atrophy. Chronic small-vessel ischemic changes of
the cerebral hemispheric white matter. No sign of acute infarction,
mass lesion, hemorrhage, hydrocephalus or extra-axial collection.

Vascular: There is atherosclerotic calcification of the major
vessels at the base of the brain.

Skull: Negative

Sinuses/Orbits: Clear/normal

Other: None
IMPRESSION: Atrophy and chronic small-vessel ischemic changes of the cerebral
hemispheric white matter. No acute finding.

## 2020-09-20 DEATH — deceased
